# Patient Record
Sex: Female | Born: 1955 | Race: White | Hispanic: No | Marital: Single | Smoking: Never smoker
Health system: Southern US, Community
[De-identification: ages and names within clinical notes are randomized; demographics above are authoritative.]

## PROBLEM LIST (undated history)

## (undated) DIAGNOSIS — K529 Noninfective gastroenteritis and colitis, unspecified: Secondary | ICD-10-CM

## (undated) DIAGNOSIS — H409 Unspecified glaucoma: Secondary | ICD-10-CM

## (undated) DIAGNOSIS — I890 Lymphedema, not elsewhere classified: Secondary | ICD-10-CM

## (undated) HISTORY — PX: ANKLE SURGERY: SHX546

## (undated) HISTORY — PX: OTHER SURGICAL HISTORY: SHX169

## (undated) HISTORY — DX: Lymphedema, not elsewhere classified: I89.0

## (undated) HISTORY — PX: CATARACT EXTRACTION, BILATERAL: SHX1313

---

## 2015-03-17 ENCOUNTER — Emergency Department (HOSPITAL_COMMUNITY)
Admission: EM | Admit: 2015-03-17 | Discharge: 2015-03-17 | Disposition: A | Discharge status: Home / Self Care | Payer: BLUE CROSS/BLUE SHIELD | Attending: Emergency Medicine | Admitting: Emergency Medicine

## 2015-03-17 ENCOUNTER — Emergency Department (HOSPITAL_COMMUNITY): Payer: BLUE CROSS/BLUE SHIELD

## 2015-03-17 ENCOUNTER — Encounter (HOSPITAL_COMMUNITY): Payer: Self-pay | Admitting: Emergency Medicine

## 2015-03-17 DIAGNOSIS — Z79899 Other long term (current) drug therapy: Secondary | ICD-10-CM | POA: Insufficient documentation

## 2015-03-17 DIAGNOSIS — H409 Unspecified glaucoma: Secondary | ICD-10-CM | POA: Diagnosis not present

## 2015-03-17 DIAGNOSIS — N200 Calculus of kidney: Secondary | ICD-10-CM | POA: Insufficient documentation

## 2015-03-17 DIAGNOSIS — Z8719 Personal history of other diseases of the digestive system: Secondary | ICD-10-CM | POA: Diagnosis not present

## 2015-03-17 DIAGNOSIS — R109 Unspecified abdominal pain: Secondary | ICD-10-CM | POA: Diagnosis present

## 2015-03-17 HISTORY — DX: Unspecified glaucoma: H40.9

## 2015-03-17 HISTORY — DX: Noninfective gastroenteritis and colitis, unspecified: K52.9

## 2015-03-17 LAB — HEPATIC FUNCTION PANEL
ALT: 19 U/L (ref 14–54)
AST: 29 U/L (ref 15–41)
Albumin: 4 g/dL (ref 3.5–5.0)
Alkaline Phosphatase: 94 U/L (ref 38–126)
BILIRUBIN DIRECT: 0.2 mg/dL (ref 0.1–0.5)
BILIRUBIN INDIRECT: 0.7 mg/dL (ref 0.3–0.9)
Total Bilirubin: 0.9 mg/dL (ref 0.3–1.2)
Total Protein: 8.1 g/dL (ref 6.5–8.1)

## 2015-03-17 LAB — URINE MICROSCOPIC-ADD ON

## 2015-03-17 LAB — CBC WITH DIFFERENTIAL/PLATELET
Basophils Absolute: 0 10*3/uL (ref 0.0–0.1)
Basophils Relative: 0 %
EOS ABS: 0 10*3/uL (ref 0.0–0.7)
EOS PCT: 0 %
HCT: 45.3 % (ref 36.0–46.0)
Hemoglobin: 14.8 g/dL (ref 12.0–15.0)
LYMPHS ABS: 0.9 10*3/uL (ref 0.7–4.0)
LYMPHS PCT: 9 %
MCH: 31.2 pg (ref 26.0–34.0)
MCHC: 32.7 g/dL (ref 30.0–36.0)
MCV: 95.4 fL (ref 78.0–100.0)
MONO ABS: 0.4 10*3/uL (ref 0.1–1.0)
Monocytes Relative: 4 %
Neutro Abs: 8.8 10*3/uL — ABNORMAL HIGH (ref 1.7–7.7)
Neutrophils Relative %: 87 %
PLATELETS: 247 10*3/uL (ref 150–400)
RBC: 4.75 MIL/uL (ref 3.87–5.11)
RDW: 13.5 % (ref 11.5–15.5)
WBC: 10.1 10*3/uL (ref 4.0–10.5)

## 2015-03-17 LAB — BASIC METABOLIC PANEL
Anion gap: 11 (ref 5–15)
BUN: 20 mg/dL (ref 6–20)
CALCIUM: 9 mg/dL (ref 8.9–10.3)
CO2: 23 mmol/L (ref 22–32)
CREATININE: 0.85 mg/dL (ref 0.44–1.00)
Chloride: 103 mmol/L (ref 101–111)
GFR calc Af Amer: >60 mL/min (ref >60)
GLUCOSE: 141 mg/dL — AB (ref 65–99)
Potassium: 4.8 mmol/L (ref 3.5–5.1)
Sodium: 137 mmol/L (ref 135–145)

## 2015-03-17 LAB — URINALYSIS, ROUTINE W REFLEX MICROSCOPIC
BILIRUBIN URINE: NEGATIVE
GLUCOSE, UA: 100 mg/dL — AB
HGB URINE DIPSTICK: NEGATIVE
Ketones, ur: NEGATIVE mg/dL
Nitrite: NEGATIVE
PROTEIN: NEGATIVE mg/dL
Specific Gravity, Urine: 1.014 (ref 1.005–1.030)
pH: 8 (ref 5.0–8.0)

## 2015-03-17 LAB — LIPASE, BLOOD: LIPASE: 29 U/L (ref 11–51)

## 2015-03-17 MED ORDER — SODIUM CHLORIDE 0.9 % IV BOLUS (SEPSIS)
1000.0000 mL | Freq: Once | INTRAVENOUS | Status: AC
  Administered 2015-03-17: 1000 mL via INTRAVENOUS

## 2015-03-17 MED ORDER — HYDROCODONE-ACETAMINOPHEN 5-325 MG PO TABS: 1.0000 | ORAL_TABLET | Freq: Four times a day (QID) | ORAL | Status: DC | PRN

## 2015-03-17 MED ORDER — CEPHALEXIN 500 MG PO CAPS: 500.0000 mg | ORAL_CAPSULE | Freq: Four times a day (QID) | ORAL | Status: DC

## 2015-03-17 MED ORDER — KETOROLAC TROMETHAMINE 30 MG/ML IJ SOLN
30.0000 mg | Freq: Once | INTRAMUSCULAR | Status: AC
  Administered 2015-03-17: 30 mg via INTRAVENOUS
  Filled 2015-03-17: qty 1

## 2015-03-17 MED ORDER — ONDANSETRON HCL 4 MG/2ML IJ SOLN
4.0000 mg | Freq: Once | INTRAMUSCULAR | Status: AC
  Administered 2015-03-17: 4 mg via INTRAVENOUS
  Filled 2015-03-17: qty 2

## 2015-03-17 MED ORDER — IBUPROFEN 400 MG PO TABS: 400.0000 mg | ORAL_TABLET | Freq: Three times a day (TID) | ORAL | Status: AC

## 2015-03-17 MED ORDER — DEXTROSE 5 % IV SOLN
1.0000 g | Freq: Once | INTRAVENOUS | Status: AC
  Administered 2015-03-17: 1 g via INTRAVENOUS
  Filled 2015-03-17: qty 10

## 2015-03-17 MED ORDER — TAMSULOSIN HCL 0.4 MG PO CAPS
0.4000 mg | ORAL_CAPSULE | Freq: Once | ORAL | Status: AC
  Administered 2015-03-17: 0.4 mg via ORAL
  Filled 2015-03-17: qty 1

## 2015-03-17 MED ORDER — TAMSULOSIN HCL 0.4 MG PO CAPS: 0.4000 mg | ORAL_CAPSULE | Freq: Every day | ORAL | Status: DC

## 2015-03-17 NOTE — ED Notes (Signed)
Patient transported to CT 

## 2015-03-17 NOTE — ED Provider Notes (Signed)
CSN: [id number]     Arrival date & time 03/17/15  0818 History   First MD Initiated Contact with Patient 03/17/15 0831     Chief Complaint  Patient presents with  . Flank Pain    left     (Consider location/radiation/quality/duration/timing/severity/associated sxs/prior Treatment) Patient is a 60 y.o. female presenting with flank pain.  Flank Pain This is a new problem. The current episode started 3 to 5 hours ago. The problem has not changed since onset.Pertinent negatives include no chest pain and no shortness of breath. Nothing aggravates the symptoms. Nothing relieves the symptoms. She has tried nothing for the symptoms. The treatment provided no relief.    Past Medical History  Diagnosis Date  . Glaucoma   . Colitis    History reviewed. No pertinent past surgical history. No family history on file. Social History  Substance Use Topics  . Smoking status: Never Smoker   . Smokeless tobacco: None  . Alcohol Use: No   OB History    No data available     Review of Systems  Constitutional: Negative for fever and fatigue.  Eyes: Negative for pain.  Respiratory: Negative for cough and shortness of breath.   Cardiovascular: Negative for chest pain and palpitations.  Gastrointestinal: Negative for nausea and vomiting.  Endocrine: Negative for polydipsia and polyuria.  Genitourinary: Positive for flank pain. Negative for hematuria and menstrual problem.  Musculoskeletal: Positive for back pain. Negative for gait problem and neck pain.  Skin: Negative for pallor and wound.  All other systems reviewed and are negative.     Allergies  Review of patient's allergies indicates no known allergies.  Home Medications   Prior to Admission medications   Medication Sig Start Date End Date Taking? Authorizing Provider  carboxymethylcellulose (REFRESH TEARS) 0.5 % SOLN Place 1 drop into both eyes 2 (two) times daily.   Yes Historical Provider, MD  latanoprost (XALATAN) 0.005 %  ophthalmic solution Place 1 drop into both eyes at bedtime. 01/10/15  Yes Historical Provider, MD  loratadine (CLARITIN) 10 MG tablet Take 10 mg by mouth at bedtime.   Yes Historical Provider, MD  timolol (TIMOPTIC) 0.5 % ophthalmic solution Place 1 drop into both eyes 2 (two) times daily. 02/26/15  Yes Historical Provider, MD  cephALEXin (KEFLEX) 500 MG capsule Take 1 capsule (500 mg total) by mouth 4 (four) times daily. 03/17/15   Marily MemosJason Yuritza Paulhus, MD  HYDROcodone-acetaminophen (NORCO) 5-325 MG tablet Take 1-2 tablets by mouth every 6 (six) hours as needed. 03/17/15   Marily MemosJason Jerricka Carvey, MD  ibuprofen (ADVIL,MOTRIN) 400 MG tablet Take 1 tablet (400 mg total) by mouth 3 (three) times daily. 03/17/15   Marily MemosJason Edlin Ford, MD  tamsulosin (FLOMAX) 0.4 MG CAPS capsule Take 1 capsule (0.4 mg total) by mouth daily. 03/17/15   Barbara CowerJason Jasreet Dickie, MD   BP 152/80 mmHg  Pulse 70  Temp(Src) 98.8 F (37.1 C) (Oral)  Resp 20  SpO2 99% Physical Exam  ED Course  Procedures (including critical care time) Labs Review Labs Reviewed  URINALYSIS, ROUTINE W REFLEX MICROSCOPIC (NOT AT [facility]) - Abnormal; Notable for the following:    APPearance CLOUDY (*)    Glucose, UA 100 (*)    Leukocytes, UA TRACE (*)    All other components within normal limits  CBC WITH DIFFERENTIAL/PLATELET - Abnormal; Notable for the following:    Neutro Abs 8.8 (*)    All other components within normal limits  BASIC METABOLIC PANEL - Abnormal; Notable for the following:  Glucose, Bld 141 (*)    All other components within normal limits  URINE MICROSCOPIC-ADD ON - Abnormal; Notable for the following:    Squamous Epithelial / LPF 0-5 (*)    Bacteria, UA FEW (*)    All other components within normal limits  HEPATIC FUNCTION PANEL  LIPASE, BLOOD    Imaging Review Ct Renal Stone Study  03/17/2015  CLINICAL DATA:  Acute onset of left flank and low back pain since 5 o'clock this morning. EXAM: CT ABDOMEN AND PELVIS WITHOUT CONTRAST TECHNIQUE: Multidetector  CT imaging of the abdomen and pelvis was performed following the standard protocol without IV contrast. COMPARISON:  None. FINDINGS: Lower chest: The lung bases are clear of acute process. No pleural effusion or pulmonary lesions. The heart is normal in size. No pericardial effusion. The distal esophagus and aorta are unremarkable. Hepatobiliary: No focal hepatic lesions or intrahepatic biliary dilatation. The gallbladder is normal. No common bile duct dilatation. Pancreas: No mass, inflammation or ductal dilatation. Spleen: Normal size.  No focal lesions. Adrenals/Urinary Tract: The adrenal glands are normal. The right kidney is normal. No renal calculi or hydronephrosis. The right ureter is normal. High-grade obstructive findings involving the left kidney with hydronephrosis and perinephric interstitial changes and fluid. A small lower pole left renal calculus is noted. There is moderate left hydroureter down to an obstructing 2 mm calculus near the left UPJ. No bladder calculi or bladder mass. Stomach/Bowel: The stomach, duodenum, small bowel and colon are grossly normal without oral contrast. The terminal ileum is normal. The appendix is normal. Vascular/Lymphatic: No mesenteric or retroperitoneal mass or adenopathy. Small scattered lymph nodes are noted. The aorta is normal in caliber. Minimal scattered atherosclerotic calcifications. Reproductive: The uterus and ovaries are unremarkable. Other: No pelvic mass or adenopathy. No free pelvic fluid collections. No inguinal mass or adenopathy. No abdominal wall hernia or subcutaneous lesions. Musculoskeletal: No significant bony findings. IMPRESSION: 1. 2 mm distal left ureteral calculus causing high-grade obstruction. 2. Small lower pole left renal calculus. 3. No other significant abdominal/pelvic findings. Electronically Signed   By: Rudie Meyer M.D.   On: 03/17/2015 10:25   I have personally reviewed and evaluated these images and lab results as part of my  medical decision-making.   EKG Interpretation None      MDM   Final diagnoses:  Kidney stone   Ureterolithiasis v diverticulitis v uti.   Workup consistent with nephrolithiasis. Symptoms improved with treatment, stable for dc home.   I have personally and contemperaneously reviewed labs and imaging and used in my decision making as above.   A medical screening exam was performed and I feel the patient has had an appropriate workup for their chief complaint at this time and likelihood of emergent condition existing is low. Their vital signs are stable. They have been counseled on decision, discharge, follow up and which symptoms necessitate immediate return to the emergency department.  They verbally stated understanding and agreement with plan and discharged in stable condition.      Marily Memos, MD 03/19/15 [phone]

## 2015-03-17 NOTE — ED Notes (Signed)
Bed: [id number] Expected date:  Expected time:  Means of arrival:  Comments: EMS- 59yo F, kidney stones?

## 2015-03-17 NOTE — ED Notes (Addendum)
Per [facility] pt from home , co left flank pain radiating to lower back. started 5 Am this Am, difficulty urinating, also nausea and emesis. Per pt she has Hx kidney stone in past and passed on her own. denies hematuria. Alert and oriented x 4. Pt received 100 mcg Fentanyl IV at 8 Am by EMS.

## 2016-03-17 IMAGING — CT CT RENAL STONE PROTOCOL
2 of 3 series · 17 of 36 positions shown, 19 images · non-contrast
Comparison: None.

CLINICAL DATA: Acute onset of left flank and low back pain since 5
o'clock this morning.

EXAM:
CT ABDOMEN AND PELVIS WITHOUT CONTRAST
TECHNIQUE: Multidetector CT imaging of the abdomen and pelvis was performed
following the standard protocol without IV contrast.

[Series 4: coronal · coronal · 0.87mm/px · 3 of 96 slices shown]
[im 32/96  soft-tissue]
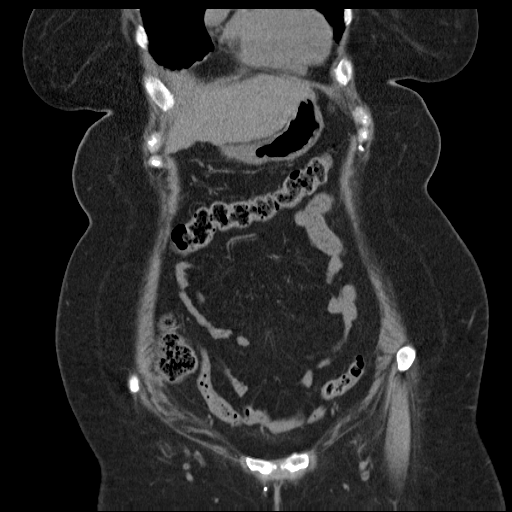
[im 43/96  soft-tissue]
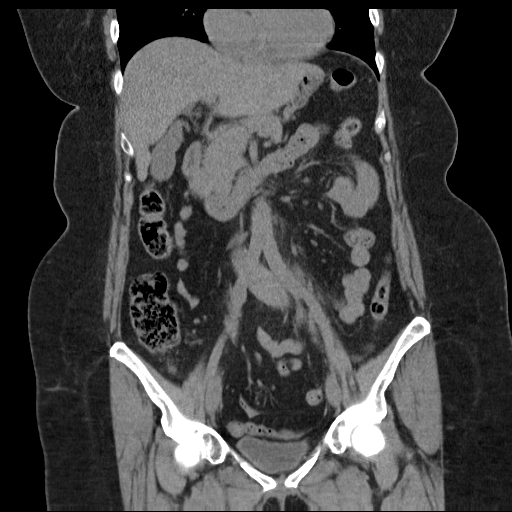
[im 53/96  soft-tissue]
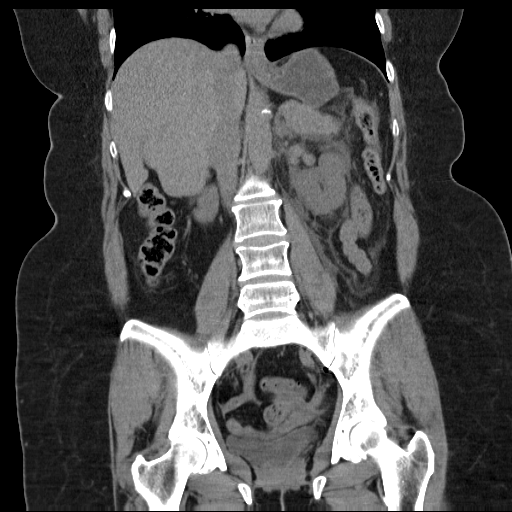

[Series 7: lung · axial · 0.73mm/px · z∈[+869,+964]mm · 14 of 22 slices shown, 16 images]
[im 2/22  soft-tissue]
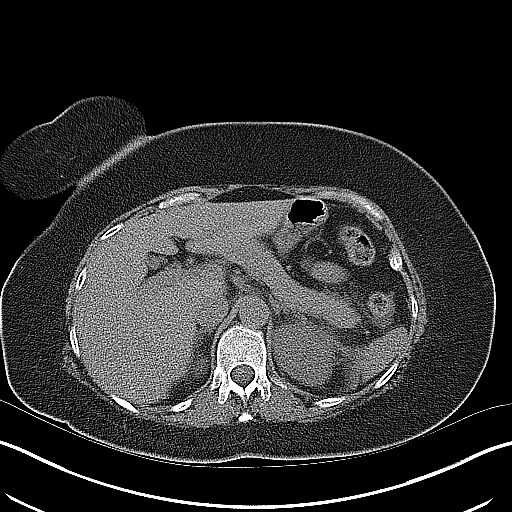
[im 2/22  bone]
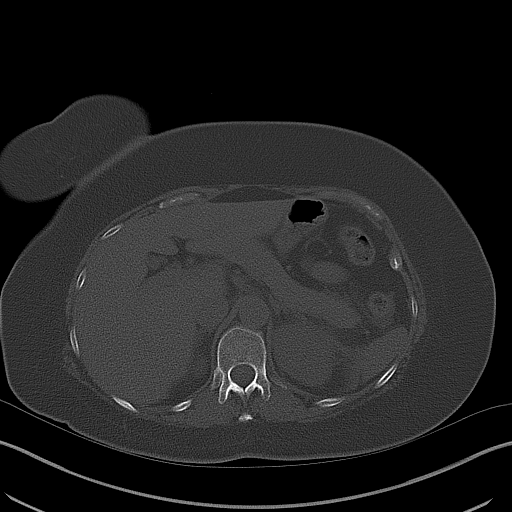
[im 4/22  soft-tissue]
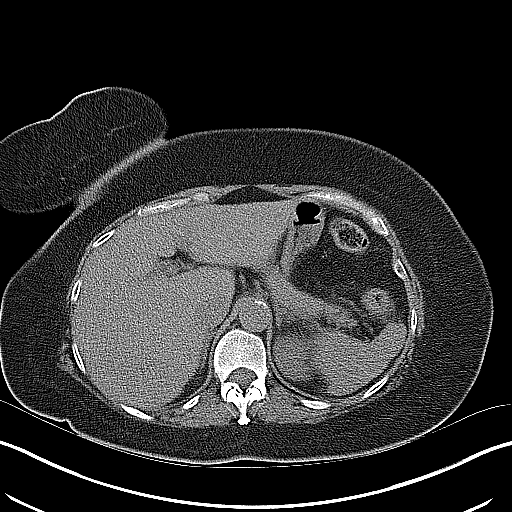
[im 5/22  soft-tissue]
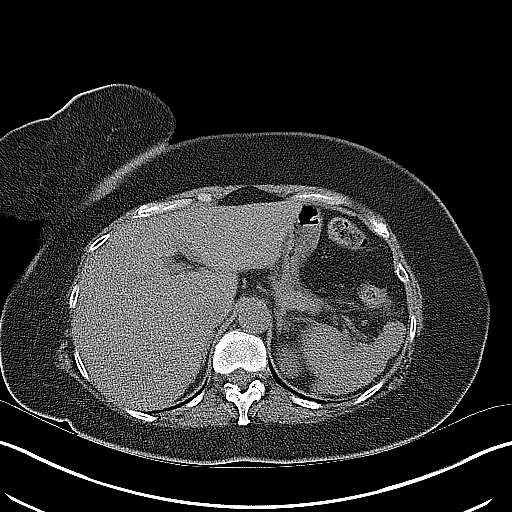
[im 7/22  soft-tissue]
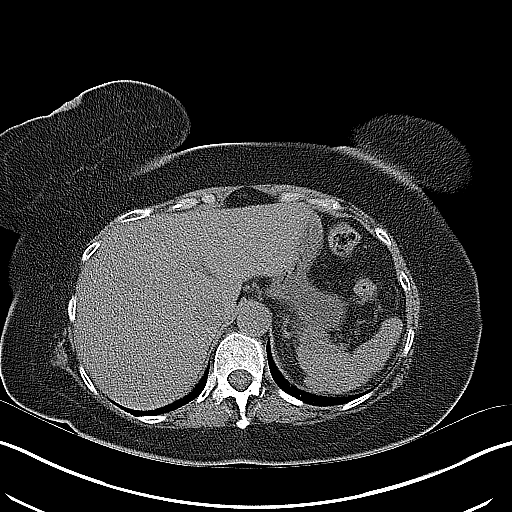
[im 8/22  soft-tissue]
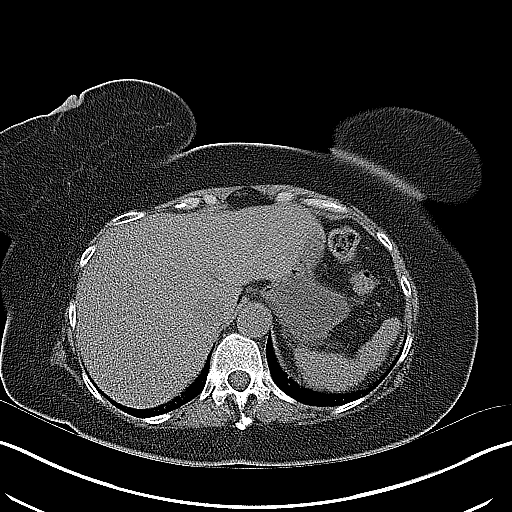
[im 9/22  soft-tissue]
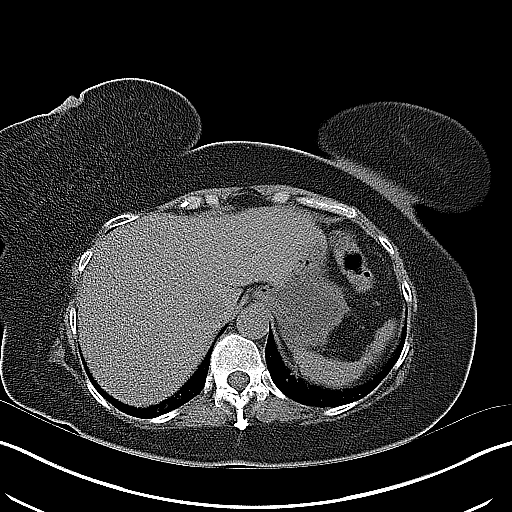
[im 11/22  soft-tissue]
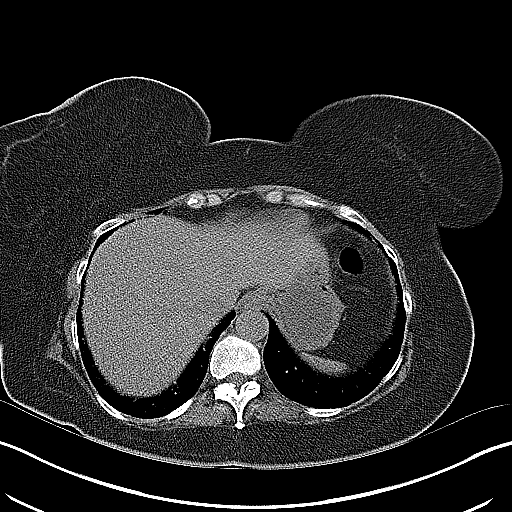
[im 12/22  soft-tissue]
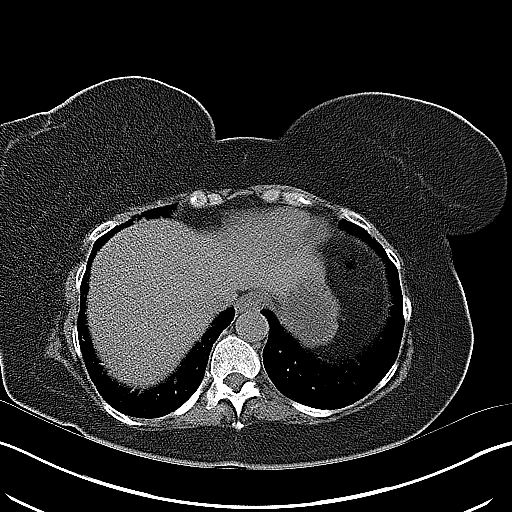
[im 14/22  soft-tissue]
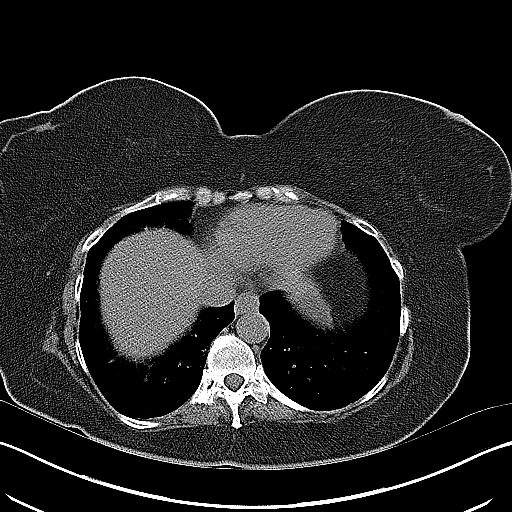
[im 14/22  bone]
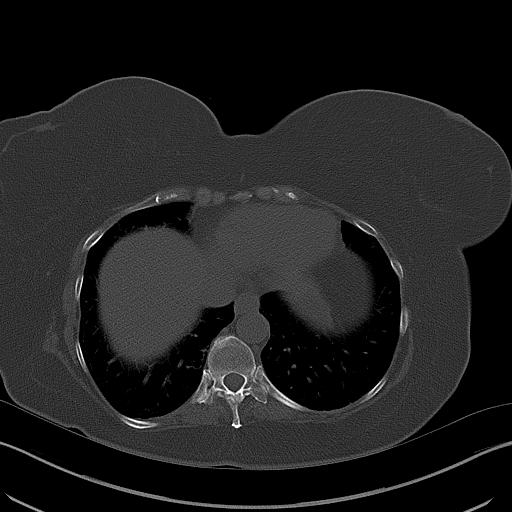
[im 15/22  soft-tissue]
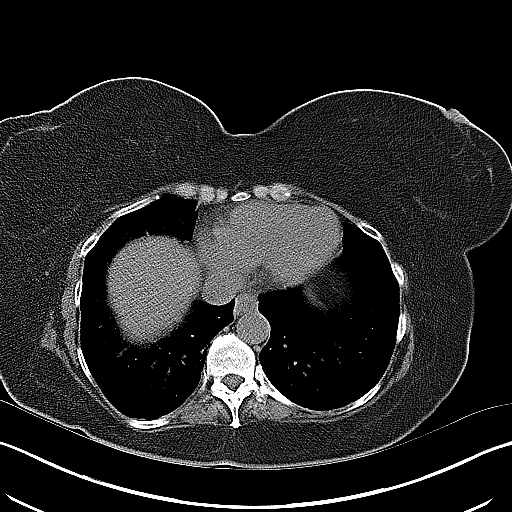
[im 17/22  soft-tissue]
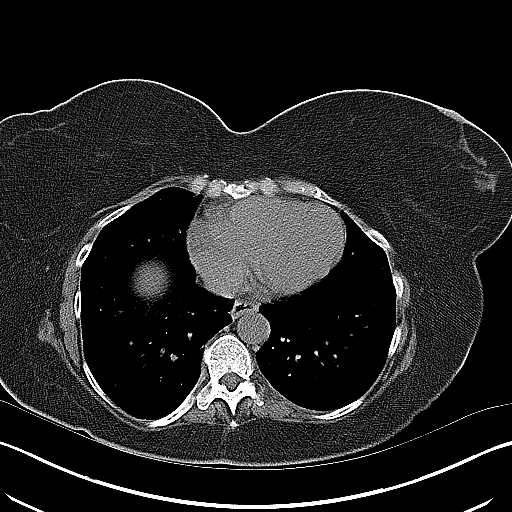
[im 18/22  soft-tissue]
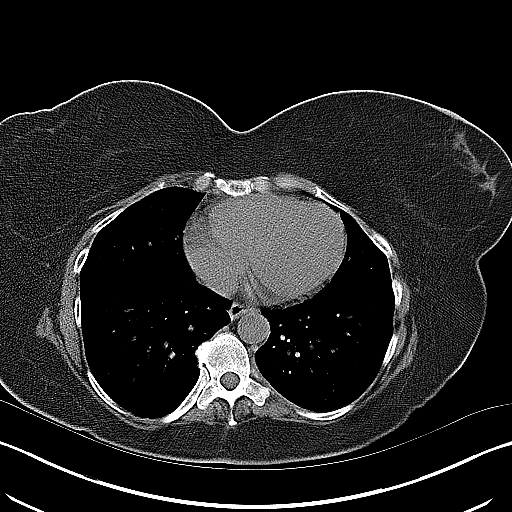
[im 19/22  soft-tissue]
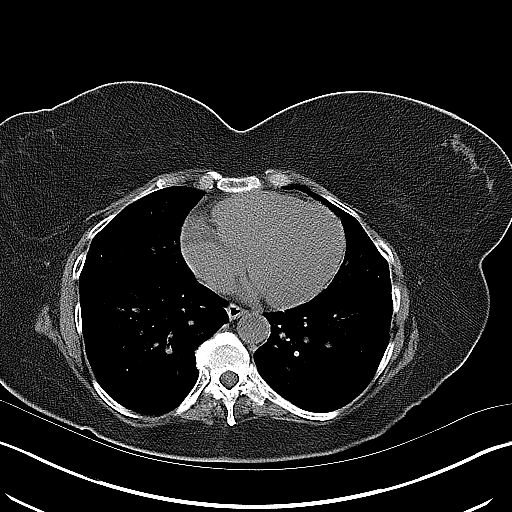
[im 21/22  soft-tissue]
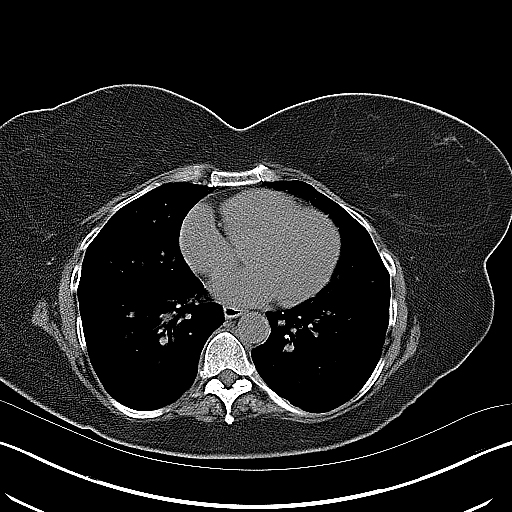

[17 of 36 positions shown; findings below may reference images not displayed]

FINDINGS: Lower chest: The lung bases are clear of acute process. No pleural
effusion or pulmonary lesions. The heart is normal in size. No
pericardial effusion. The distal esophagus and aorta are
unremarkable.

Hepatobiliary: No focal hepatic lesions or intrahepatic biliary
dilatation. The gallbladder is normal. No common bile duct
dilatation.

Pancreas: No mass, inflammation or ductal dilatation.

Spleen: Normal size.  No focal lesions.

Adrenals/Urinary Tract: The adrenal glands are normal.

The right kidney is normal. No renal calculi or hydronephrosis. The
right ureter is normal.

High-grade obstructive findings involving the left kidney with
hydronephrosis and perinephric interstitial changes and fluid. A
small lower pole left renal calculus is noted. There is moderate
left hydroureter down to an obstructing 2 mm calculus near the left
UPJ. No bladder calculi or bladder mass.

Stomach/Bowel: The stomach, duodenum, small bowel and colon are
grossly normal without oral contrast. The terminal ileum is normal.
The appendix is normal.

Vascular/Lymphatic: No mesenteric or retroperitoneal mass or
adenopathy. Small scattered lymph nodes are noted. The aorta is
normal in caliber. Minimal scattered atherosclerotic calcifications.

Reproductive: The uterus and ovaries are unremarkable.

Other: No pelvic mass or adenopathy. No free pelvic fluid
collections. No inguinal mass or adenopathy. No abdominal wall
hernia or subcutaneous lesions.

Musculoskeletal: No significant bony findings.
IMPRESSION: 1. 2 mm distal left ureteral calculus causing high-grade
obstruction.
2. Small lower pole left renal calculus.
3. No other significant abdominal/pelvic findings.

## 2016-04-27 ENCOUNTER — Ambulatory Visit: Payer: BLUE CROSS/BLUE SHIELD | Admitting: Occupational Therapy

## 2017-07-21 ENCOUNTER — Encounter: Payer: Self-pay | Admitting: Gynecology

## 2017-07-21 ENCOUNTER — Ambulatory Visit: Payer: BLUE CROSS/BLUE SHIELD | Admitting: Gynecology

## 2017-07-21 VITALS — BP 122/78 | Ht 62.0 in | Wt 174.0 lb

## 2017-07-21 DIAGNOSIS — R1032 Left lower quadrant pain: Secondary | ICD-10-CM

## 2017-07-21 DIAGNOSIS — N952 Postmenopausal atrophic vaginitis: Secondary | ICD-10-CM | POA: Diagnosis not present

## 2017-07-21 DIAGNOSIS — Z01419 Encounter for gynecological examination (general) (routine) without abnormal findings: Secondary | ICD-10-CM | POA: Diagnosis not present

## 2017-07-21 NOTE — Addendum Note (Signed)
Addended by: Dayna BarkerGARDNER, KIMBERLY K on: 07/21/2017 01:58 PM   Modules accepted: Orders
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
## 2017-07-21 NOTE — Patient Instructions (Signed)
Follow up for ultrasound as scheduled 

## 2017-07-24 LAB — PAP IG W/ RFLX HPV ASCU

## 2017-08-03 ENCOUNTER — Other Ambulatory Visit: Payer: Self-pay | Admitting: Gynecology

## 2017-08-03 ENCOUNTER — Encounter: Payer: Self-pay | Admitting: Gynecology

## 2017-08-03 ENCOUNTER — Ambulatory Visit: Payer: BLUE CROSS/BLUE SHIELD | Admitting: Gynecology

## 2017-08-03 ENCOUNTER — Ambulatory Visit (INDEPENDENT_AMBULATORY_CARE_PROVIDER_SITE_OTHER): Payer: BLUE CROSS/BLUE SHIELD

## 2017-08-03 VITALS — BP 126/80

## 2017-08-03 DIAGNOSIS — R1032 Left lower quadrant pain: Secondary | ICD-10-CM

## 2017-08-03 DIAGNOSIS — R102 Pelvic and perineal pain: Secondary | ICD-10-CM

## 2017-08-03 NOTE — Progress Notes (Signed)
    Tereasa Coopileen Sallie 12/02/1955 [id number]        62 y.o.  G0P0000 resents with a history of left inguinal type pain.  History outlined in her 07/21/2017 note.  Ultrasound was ordered to rule out ovarian pathology.  Past medical history,surgical history, problem list, medications, allergies, family history and social history were all reviewed and documented in the EPIC chart.  Directed ROS with pertinent positives and negatives documented in the history of present illness/assessment and plan.  Exam: Vitals:   08/03/17 1356  BP: 126/80   General appearance:  Normal  Ultrasound transabdominal shows uterus normal size and echotexture.  Endometrial echo 5.6 mm.  Left ovary normal and atrophic.  Right ovary not seen.  Right adnexa without pathology.  Cul-de-sac negative.  Assessment/Plan:  62 y.o. G0P0000 with left inguinal pain thought to be due to her history of lymphatic swelling.  Also being followed for lymphocytic colitis.  Having bloating that she actually just saw her gastroenterologist for and is going to start a new medication.  Ultrasound today was to rule out any possible ovarian contribution.  Her exam is normal with no evidence of GYN pathology.  Patient will continue to follow-up with her other physicians in reference to her lymphocytic colitis and lymphatic swelling.  She will follow-up here in 1 year for annual exam.   Dara Lordsimothy P Fontaine MD, 2:18 PM 08/03/2017

## 2017-08-03 NOTE — Patient Instructions (Signed)
Follow-up in 1 year for annual exam 

## 2018-07-26 ENCOUNTER — Ambulatory Visit: Payer: Medicare Other | Attending: Family Medicine

## 2018-07-26 ENCOUNTER — Other Ambulatory Visit: Payer: Self-pay

## 2018-07-26 DIAGNOSIS — R2689 Other abnormalities of gait and mobility: Secondary | ICD-10-CM | POA: Diagnosis present

## 2018-07-26 DIAGNOSIS — M25652 Stiffness of left hip, not elsewhere classified: Secondary | ICD-10-CM | POA: Insufficient documentation

## 2018-07-26 DIAGNOSIS — M79604 Pain in right leg: Secondary | ICD-10-CM | POA: Diagnosis present

## 2018-07-26 DIAGNOSIS — R293 Abnormal posture: Secondary | ICD-10-CM | POA: Insufficient documentation

## 2018-07-26 DIAGNOSIS — M6281 Muscle weakness (generalized): Secondary | ICD-10-CM

## 2018-07-26 NOTE — Patient Instructions (Signed)
Access Code: [email or web address]  URL: [email or web address]  Date: 07/26/2018  Prepared by: Sigurd Sos   Exercises  Seated Hamstring Stretch - 3 reps - 1 sets - 20 hold - 2x daily - 7x weekly  Seated Shoulder Horizontal Abduction with Resistance - 10 reps - 2 sets - 2x daily - 7x weekly  Seated Long Arc Quad - 10 reps - 2 sets - 3x daily - 7x weekly  Sit to Stand without Arm Support - 10 reps - 2 sets - 2x daily - 7x weekly

## 2018-07-26 NOTE — Therapy (Signed)
[facility] [address], [address] [place], [place], [address] Phone: [phone]   Fax:  [phone]  Physical Therapy Evaluation  Patient Details  Name: Penny Hayes MRN: [id number] Date of Birth: 03/14/1955 Referring Provider (PT): Tracey HarriesBouska, David, MD   Encounter Date: 07/26/2018  PT End of Session - 07/26/18 1519    Visit Number  1    Date for PT Re-Evaluation  09/20/18    Authorization Type  Medicare    PT Start Time  1430    PT Stop Time  1520    PT Time Calculation (min)  50 min    Activity Tolerance  Patient tolerated treatment well    Behavior During Therapy  [facility] for tasks assessed/performed       Past Medical History:  Diagnosis Date  . Colitis   . Glaucoma   . Lymphedema     Past Surgical History:  Procedure Laterality Date  . ANKLE SURGERY    . CATARACT EXTRACTION, BILATERAL    . eye injections      There were no vitals filed for this visit.   Subjective Assessment - 07/26/18 1435    Subjective  Pt presents to PT with complaints of Rt LE (quad and anterior leg below the knee) pain that began when she stepped into a hole 05/15/2018.  Pt has history of Lt hip pain and OA. Pt started taking Meloxicam ~10 days ago with significant improvement in symptoms.   Pt now reports Lt hip pain, Rt knee and LE pain and balance/endurance deficits.  Pt expresses an interest in becoming more active, healthy and improve overall mobility.    Pertinent History  Lt LE lympadema, glaucoma with peripheral vision loss    Limitations  Standing;Walking    How long can you stand comfortably?  15-20 minutes    How long can you walk comfortably?  unsure because of heat    Diagnostic tests  x-ray: ordered for Lt hip    Patient Stated Goals  improve mobility, get back to regular exercise, improve safety    Currently in Pain?  No/denies   Not since starting to take Meloxicam        [facility] - 07/26/18 0001      Assessment   Medical Diagnosis  Rt leg muscle strain    Referring Provider (PT)  Tracey HarriesBouska, David, MD    Onset Date/Surgical Date  05/15/18    Next MD Visit  none    Prior Therapy  none      Precautions   Precautions  Other (comment)    Precaution Comments  glaucoma with limited peripheral vision      Restrictions   Weight Bearing Restrictions  No      Balance Screen   Has the patient fallen in the past 6 months  Yes    How many times?  1   fall on 05/15/18 in a hole in yard   Has the patient had a decrease in activity level because of a fear of falling?   No    Is the patient reluctant to leave their home because of a fear of falling?   No      Home Environment   Living Environment  Private residence    Living Arrangements  Alone    Type of Home  Apartment    Home Access  Stairs to enter    Home Layout  One level      Prior Function  Level of Independence  Independent    Vocation  On disability   due to glaucoma   Leisure  reading, writing      Cognition   Overall Cognitive Status  Within Functional Limits for tasks assessed      Observation/Other Assessments   Focus on Therapeutic Outcomes (FOTO)   48% limitation      Posture/Postural Control   Posture/Postural Control  Postural limitations    Postural Limitations  Forward head;Rounded Shoulders;Flexed trunk      ROM / Strength   AROM / PROM / Strength  AROM;PROM;Strength      AROM   Overall AROM   Deficits    Overall AROM Comments  Lt hip flexibility is limited by 50% vs the Rt.  IR most limited on the Rt. Bil knee A/ROM is full without pain.        PROM   Overall PROM   Deficits    Overall PROM Comments  Lt hip P/ROM limited by 50% on the Lt vs Rt      Strength   Overall Strength  Deficits    Overall Strength Comments  Bil UE strength 4 to 4+/5.      Strength Assessment Site  Knee;Hip    Right/Left Hip  Right;Left    Right Hip Flexion  4/5    Right Hip External Rotation   4/5    Right Hip Internal  Rotation  4/5    Left Hip Flexion  3+/5    Left Hip External Rotation  4-/5    Left Hip Internal Rotation  4-/5    Right/Left Knee  Right;Left    Right Knee Flexion  4+/5    Right Knee Extension  4+/5    Left Knee Flexion  4+/5    Left Knee Extension  4+/5      Palpation   Palpation comment  palpable tenderness over Rt medial quad and medial knee joint line.  Hard end feel with Lt hip IR      Transfers   Transfers  Sit to Stand;Stand to Sit    Sit to Stand  With upper extremity assist    Five time sit to stand comments   23.28 seconds without UE suppot    Stand to Sit  With upper extremity assist      Ambulation/Gait   Ambulation/Gait  Yes    Gait Pattern  Step-through pattern;Decreased dorsiflexion - right;Decreased dorsiflexion - left;Decreased stride length                Objective measurements completed on examination: See above findings.              PT Education - 07/26/18 1515    Education Details  Access Code: [email or web address]    Person(s) Educated  Patient    Methods  Explanation;Demonstration;Handout    Comprehension  Verbalized understanding;Returned demonstration       PT Short Term Goals - 07/26/18 1434      PT SHORT TERM GOAL #1   Title  be independent in initial HEP    Time  4    Period  Weeks    Status  New    Target Date  08/23/18      PT SHORT TERM GOAL #2   Title  demonstrate erect posture in sitting and standing and report corrections with activity    Time  4    Period  Weeks    Status  New    Target Date  08/23/18      PT SHORT TERM GOAL #3   Title  improve 5x sit to stand to < or = to 20 seconds to improve balance and safety    Time  8    Period  Weeks    Status  New    Target Date  09/20/18      PT SHORT TERM GOAL #4   Title  initiate walking program and verbalize understanding of how to safely progress    Time  8    Period  Weeks    Status  New    Target Date  09/20/18        PT Long Term Goals - 07/26/18 1434       PT LONG TERM GOAL #1   Title  be independent in advanced HEP    Time  8    Period  Weeks    Status  New    Target Date  09/20/18      PT LONG TERM GOAL #2   Title  reduce FOTO to < or = to 35% limitation    Time  8    Period  Weeks    Status  New    Target Date  09/20/18      PT LONG TERM GOAL #3   Title  perform 5x sit to stand in < or = to 14 seconds to improve safety and stability    Time  8    Period  Weeks    Status  New    Target Date  09/20/18      PT LONG TERM GOAL #4   Title  demonstrate 4/5 to 4+/5 bil hip strength to improve endurance for walking and community tasks    Time  8    Period  Weeks    Status  New    Target Date  09/20/18      PT LONG TERM GOAL #5   Title  demonstrate symmetry with ambulation with heel strike bilaterally    Time  8    Period  Weeks    Status  New    Target Date  09/20/18             Plan - 07/26/18 1541    Clinical Impression Statement  Pt presents to PT s/p a fall when stepping into a 7 inch hold in her yard on 05/15/18.  Pt has history of Lt hip OA and pain and had resultant Rt thigh and LE pain after this fall.  10 days ago, she started Meloxicam and her Rt LE pain diminished.  Pt with main concern of weakness and worsening balance and overall deconditioning.  Pt demonstrates poor posture with flexed trunk, forward head and rounded shoulder posture.  Pt with limited Lt hip A/ROM, reduced strength in the hips and gait abnormality. Pt performed 5x sit to stand in 23.28 seconds with uncontrolled descent indicating a moderate falls risk.  Pt will benefit from skilled PT for bil LE strength, postural and core strength and balance/gait training to improve safety, endurance and overall conditioning.    Personal Factors and Comorbidities  Comorbidity 1;Comorbidity 2    Comorbidities  glaucoma with limited peripheral vision, Rt ankle surgery, Lt hip OA    Examination-Activity Limitations  Locomotion Level;Stand     Examination-Participation Restrictions  Community Activity    Stability/Clinical Decision Making  Evolving/Moderate complexity    Clinical Decision Making  Moderate    Rehab Potential  Good  PT Frequency  2x / week    PT Duration  8 weeks    PT Treatment/Interventions  ADLs/Self Care Home Management;Gait training;Stair training;Functional mobility training;Therapeutic activities;Therapeutic exercise;Balance training;Neuromuscular re-education;Manual techniques;Patient/family education;Passive range of motion;Taping;Dry needling    PT Next Visit Plan  work on postural strength, LE strength, gait, balance, Lt hip ROM    PT Home Exercise Plan  Access Code: [email or web address]    Consulted and Agree with Plan of Care  Patient       Patient will benefit from skilled therapeutic intervention in order to improve the following deficits and impairments:  Abnormal gait, Decreased activity tolerance, Decreased balance, Decreased mobility, Decreased endurance, Decreased range of motion, Decreased strength, Hypomobility, Impaired flexibility, Postural dysfunction  Visit Diagnosis: 1. Other abnormalities of gait and mobility   2. Muscle weakness (generalized)   3. Abnormal posture   4. Stiffness of left hip, not elsewhere classified   5. Pain in right leg        Problem List There are no active problems to display for this patient.   Sigurd Sos, PT 07/26/18 3:46 PM  [facility] [address] [place], [place], [address] Phone: [phone]   Fax:  [phone]  Name: Khamya Topp MRN: [id number] Date of Birth: 02/15/1955

## 2018-08-07 ENCOUNTER — Other Ambulatory Visit: Payer: Self-pay

## 2018-08-07 ENCOUNTER — Ambulatory Visit: Payer: Medicare Other | Attending: Family Medicine

## 2018-08-07 DIAGNOSIS — R2689 Other abnormalities of gait and mobility: Secondary | ICD-10-CM

## 2018-08-07 DIAGNOSIS — M79604 Pain in right leg: Secondary | ICD-10-CM | POA: Diagnosis present

## 2018-08-07 DIAGNOSIS — M6281 Muscle weakness (generalized): Secondary | ICD-10-CM | POA: Diagnosis present

## 2018-08-07 DIAGNOSIS — R293 Abnormal posture: Secondary | ICD-10-CM | POA: Insufficient documentation

## 2018-08-07 DIAGNOSIS — M25652 Stiffness of left hip, not elsewhere classified: Secondary | ICD-10-CM

## 2018-08-07 NOTE — Therapy (Signed)
[facility] [address], [address] [place], [place], [address] Phone: [phone]   Fax:  [phone]  Physical Therapy Treatment  Patient Details  Name: Penny Hayes MRN: [id number] Date of Birth: 08/14/1955 Referring Provider (PT): Tracey HarriesBouska, David, MD   Encounter Date: 08/07/2018  PT End of Session - 08/07/18 1046    Visit Number  2    Date for PT Re-Evaluation  09/20/18    Authorization Type  Medicare    PT Start Time  1001    PT Stop Time  1043    PT Time Calculation (min)  42 min    Activity Tolerance  Patient tolerated treatment well    Behavior During Therapy  [facility] for tasks assessed/performed       Past Medical History:  Diagnosis Date  . Colitis   . Glaucoma   . Lymphedema     Past Surgical History:  Procedure Laterality Date  . ANKLE SURGERY    . CATARACT EXTRACTION, BILATERAL    . eye injections      There were no vitals filed for this visit.  Subjective Assessment - 08/07/18 1010    Subjective  I'm doing well with my exercises at home.  I'm stiff today.    Currently in Pain?  No/denies                       [facility]/Exercise - 08/07/18 0001      Exercises   Exercises  Knee/Hip;Ankle;Lumbar      Lumbar Exercises: Stretches   Figure 4 Stretch  2 reps;20 seconds;Seated;With overpressure      Lumbar Exercises: Standing   Row  Strengthening;Both;20 reps;Theraband    Theraband Level (Row)  Level 2 (Red)    Shoulder Extension  Strengthening;Both;20 reps;Theraband    Theraband Level (Shoulder Extension)  Level 2 (Red)      Lumbar Exercises: Seated   Other Seated Lumbar Exercises  horizontal abduction red band 2x10      Knee/Hip Exercises: Stretches   Active Hamstring Stretch  Both;3 reps;20 seconds      Knee/Hip Exercises: Aerobic   Nustep  Level 1 x 8 minutes    PT present to discuss progress     Knee/Hip Exercises: Standing   Hip Abduction  Stengthening;Both;2  sets;10 reps    Hip Extension  Stengthening;Both;2 sets;10 reps    Rocker Board  3 minutes      Knee/Hip Exercises: Seated   Long Arc Quad  Strengthening;Both;1 set;10 reps    Sit to [employer]Sand  20 reps;without UE support               PT Short Term Goals - 07/26/18 1434      PT SHORT TERM GOAL #1   Title  be independent in initial HEP    Time  4    Period  Weeks    Status  New    Target Date  08/23/18      PT SHORT TERM GOAL #2   Title  demonstrate erect posture in sitting and standing and report corrections with activity    Time  4    Period  Weeks    Status  New    Target Date  08/23/18      PT SHORT TERM GOAL #3   Title  improve 5x sit to stand to < or = to 20 seconds to improve balance and safety    Time  8  Period  Weeks    Status  New    Target Date  09/20/18      PT SHORT TERM GOAL #4   Title  initiate walking program and verbalize understanding of how to safely progress    Time  8    Period  Weeks    Status  New    Target Date  09/20/18        PT Long Term Goals - 07/26/18 1434      PT LONG TERM GOAL #1   Title  be independent in advanced HEP    Time  8    Period  Weeks    Status  New    Target Date  09/20/18      PT LONG TERM GOAL #2   Title  reduce FOTO to < or = to 35% limitation    Time  8    Period  Weeks    Status  New    Target Date  09/20/18      PT LONG TERM GOAL #3   Title  perform 5x sit to stand in < or = to 14 seconds to improve safety and stability    Time  8    Period  Weeks    Status  New    Target Date  09/20/18      PT LONG TERM GOAL #4   Title  demonstrate 4/5 to 4+/5 bil hip strength to improve endurance for walking and community tasks    Time  8    Period  Weeks    Status  New    Target Date  09/20/18      PT LONG TERM GOAL #5   Title  demonstrate symmetry with ambulation with heel strike bilaterally    Time  8    Period  Weeks    Status  New    Target Date  09/20/18            Plan - 08/07/18  1031    Clinical Impression Statement  Pt with first time follow-up after evaluation.  Pt is independent and compliant with HEP and requires minor verbal cues for full knee extension with hamstring stretch.  Pt with improved technique with sit to stand today with controlled consent.  Pt requires frequent verbal cues for posture and scapular depression with theraband exercises.  Pt will continue to benefit from skilled PT for LE strength, flexibility and postural alignment.    PT Frequency  2x / week    PT Duration  8 weeks    PT Treatment/Interventions  ADLs/Self Care Home Management;Gait training;Stair training;Functional mobility training;Therapeutic activities;Therapeutic exercise;Balance training;Neuromuscular re-education;Manual techniques;Patient/family education;Passive range of motion;Taping;Dry needling    PT Next Visit Plan  work on postural strength, LE strength, gait, balance, Lt hip ROM.  Add to HEP: scapular theraband (rows/extension), balance exercises    PT Home Exercise Plan  Access Code: [email or web address]    Consulted and Agree with Plan of Care  Patient       Patient will benefit from skilled therapeutic intervention in order to improve the following deficits and impairments:  Abnormal gait, Decreased activity tolerance, Decreased balance, Decreased mobility, Decreased endurance, Decreased range of motion, Decreased strength, Hypomobility, Impaired flexibility, Postural dysfunction  Visit Diagnosis: 1. Muscle weakness (generalized)   2. Other abnormalities of gait and mobility   3. Abnormal posture   4. Stiffness of left hip, not elsewhere classified   5. Pain in right leg  Problem List There are no active problems to display for this patient.    Sigurd Sos, PT 08/07/18 10:49 AM  [facility] [address] [place], [place], [address] Phone: [phone]   Fax:  [phone]  Name: Penny Hayes MRN:  [id number] Date of Birth: 1955-09-01

## 2018-08-10 ENCOUNTER — Ambulatory Visit: Payer: Medicare Other | Admitting: Physical Therapy

## 2018-08-14 ENCOUNTER — Other Ambulatory Visit: Payer: Self-pay

## 2018-08-14 ENCOUNTER — Ambulatory Visit: Payer: Medicare Other

## 2018-08-14 DIAGNOSIS — M6281 Muscle weakness (generalized): Secondary | ICD-10-CM

## 2018-08-14 DIAGNOSIS — R2689 Other abnormalities of gait and mobility: Secondary | ICD-10-CM

## 2018-08-14 DIAGNOSIS — M79604 Pain in right leg: Secondary | ICD-10-CM

## 2018-08-14 DIAGNOSIS — R293 Abnormal posture: Secondary | ICD-10-CM

## 2018-08-14 DIAGNOSIS — M25652 Stiffness of left hip, not elsewhere classified: Secondary | ICD-10-CM

## 2018-08-14 NOTE — Patient Instructions (Signed)
Access Code: [email or web address]  URL: [email or web address]  Date: 08/14/2018  Prepared by: Sigurd Sos   Exercises Standing Hip Abduction - 10 reps - 2 sets - 2x daily - 7x weekly Standing Hip Extension - 10 reps - 2 sets - 2x daily - 7x weekly Standing Row with Anchored Resistance - 10 reps - 2 sets - 2x daily - 7x weekly Shoulder extension with resistance - Neutral - 10 reps - 2 sets - 7x weekly

## 2018-08-14 NOTE — Therapy (Signed)
[facility] [address], [address] [place], [place], [address] Phone: [phone]   Fax:  [phone]  Physical Therapy Treatment  Patient Details  Name: Penny Hayes MRN: [id number] Date of Birth: 03/09/1955 Referring Provider (PT): Tracey HarriesBouska, David, MD   Encounter Date: 08/14/2018  PT End of Session - 08/14/18 1045    Visit Number  3    Date for PT Re-Evaluation  09/20/18    Authorization Type  Medicare    PT Start Time  1001    PT Stop Time  1043    PT Time Calculation (min)  42 min    Activity Tolerance  Patient tolerated treatment well    Behavior During Therapy  [facility] for tasks assessed/performed       Past Medical History:  Diagnosis Date  . Colitis   . Glaucoma   . Lymphedema     Past Surgical History:  Procedure Laterality Date  . ANKLE SURGERY    . CATARACT EXTRACTION, BILATERAL    . eye injections      There were no vitals filed for this visit.  Subjective Assessment - 08/14/18 0959    Subjective  I am doing well.    Pertinent History  Lt LE lympadema, glaucoma with peripheral vision loss    Currently in Pain?  No/denies         [facility] Assessment - 08/14/18 0001      Transfers   Five time sit to stand comments   18.91 seconds without UE support                   [facility] Adult PT Treatment/Exercise - 08/14/18 0001      Lumbar Exercises: Stretches   Figure 4 Stretch  2 reps;20 seconds;Seated;With overpressure      Lumbar Exercises: Standing   Row  Strengthening;Both;20 reps;Theraband    Theraband Level (Row)  Level 2 (Red)    Shoulder Extension  Strengthening;Both;20 reps;Theraband    Theraband Level (Shoulder Extension)  Level 2 (Red)      Knee/Hip Exercises: Stretches   Active Hamstring Stretch  Both;3 reps;20 seconds      Knee/Hip Exercises: Aerobic   Nustep  Level 1 x 8 minutes    PT present to discuss progress     Knee/Hip Exercises: Standing   Hip Abduction   Stengthening;Both;2 sets;10 reps    Hip Extension  Stengthening;Both;2 sets;10 reps    Rocker Board  3 minutes    Other Standing Knee Exercises  standing on black balance pad: weight shifting 3 ways x 1.5 minutes each without UE suppport.        Knee/Hip Exercises: Seated   Sit to Sand  20 reps;without UE support             PT Education - 08/14/18 1024    Education Details  Access Code: [email or web address]    Person(s) Educated  Patient    Methods  Explanation;Demonstration;Handout    Comprehension  Verbalized understanding;Returned demonstration       PT Short Term Goals - 08/14/18 1003      PT SHORT TERM GOAL #1   Title  be independent in initial HEP    Status  Achieved      PT SHORT TERM GOAL #4   Title  initiate walking program and verbalize understanding of how to safely progress    Baseline  too hot    Time  8    Period  Weeks  Status  On-going        PT Long Term Goals - 07/26/18 1434      PT LONG TERM GOAL #1   Title  be independent in advanced HEP    Time  8    Period  Weeks    Status  New    Target Date  09/20/18      PT LONG TERM GOAL #2   Title  reduce FOTO to < or = to 35% limitation    Time  8    Period  Weeks    Status  New    Target Date  09/20/18      PT LONG TERM GOAL #3   Title  perform 5x sit to stand in < or = to 14 seconds to improve safety and stability    Time  8    Period  Weeks    Status  New    Target Date  09/20/18      PT LONG TERM GOAL #4   Title  demonstrate 4/5 to 4+/5 bil hip strength to improve endurance for walking and community tasks    Time  8    Period  Weeks    Status  New    Target Date  09/20/18      PT LONG TERM GOAL #5   Title  demonstrate symmetry with ambulation with heel strike bilaterally    Time  8    Period  Weeks    Status  New    Target Date  09/20/18            Plan - 08/14/18 1014    Clinical Impression Statement  Pt is making steady progress with strength, endurance and balance.  Pt with  improved time with 5x sit to stand to 18.91 indicating a lower falls risk although falls risk is still high.  Pt demonstrates improved control with stand to sit transition.  Pt reports using less UE support with negotiating steps.  PT added standing strength exercises to address functional balance.  Pt required minor demo and tactile cues for safety and technique throughout session.  Pt will continue to benefit from skilled PT for balance, strength and endurance activities.    PT Frequency  2x / week    PT Duration  8 weeks    PT Treatment/Interventions  ADLs/Self Care Home Management;Gait training;Stair training;Functional mobility training;Therapeutic activities;Therapeutic exercise;Balance training;Neuromuscular re-education;Manual techniques;Patient/family education;Passive range of motion;Taping;Dry needling    PT Next Visit Plan  work on postural strength, LE strength, gait, balance, Lt hip ROM. Review new HEP.    PT Home Exercise Plan  Access Code: [email or web address]    Consulted and Agree with Plan of Care  Patient       Patient will benefit from skilled therapeutic intervention in order to improve the following deficits and impairments:  Abnormal gait, Decreased activity tolerance, Decreased balance, Decreased mobility, Decreased endurance, Decreased range of motion, Decreased strength, Hypomobility, Impaired flexibility, Postural dysfunction  Visit Diagnosis: 1. Other abnormalities of gait and mobility   2. Muscle weakness (generalized)   3. Abnormal posture   4. Stiffness of left hip, not elsewhere classified   5. Pain in right leg        Problem List There are no active problems to display for this patient.   Sigurd Sos, PT 08/14/18 10:49 AM  [facility] [address], [place], [place], [address] Phone: [phone]   Fax:  [phone]  Name: Penny Hayes MRN: [id number] Date of Birth: 03/06/1955

## 2018-08-17 ENCOUNTER — Ambulatory Visit: Payer: Medicare Other | Admitting: Physical Therapy

## 2018-08-17 ENCOUNTER — Encounter: Payer: Self-pay | Admitting: Physical Therapy

## 2018-08-17 ENCOUNTER — Other Ambulatory Visit: Payer: Self-pay

## 2018-08-17 DIAGNOSIS — M25652 Stiffness of left hip, not elsewhere classified: Secondary | ICD-10-CM

## 2018-08-17 DIAGNOSIS — R293 Abnormal posture: Secondary | ICD-10-CM

## 2018-08-17 DIAGNOSIS — M6281 Muscle weakness (generalized): Secondary | ICD-10-CM | POA: Diagnosis not present

## 2018-08-17 DIAGNOSIS — R2689 Other abnormalities of gait and mobility: Secondary | ICD-10-CM

## 2018-08-17 DIAGNOSIS — M79604 Pain in right leg: Secondary | ICD-10-CM

## 2018-08-17 NOTE — Therapy (Signed)
[facility] [address], [place], [place], [address] Phone: [phone]   Fax:  [phone]  Physical Therapy Treatment  Patient Details  Name: Penny Hayes MRN: [id number] Date of Birth: [birth year] Referring Provider (PT): Bernerd Limbo, MD   Encounter Date: 08/17/2018  PT End of Session - 08/17/18 1013    Visit Number  4    Date for PT Re-Evaluation  09/20/18    Authorization Type  Medicare    PT Start Time  1013    PT Stop Time  1052    PT Time Calculation (min)  39 min    Activity Tolerance  Patient tolerated treatment well    Behavior During Therapy  [facility] for tasks assessed/performed       Past Medical History:  Diagnosis Date  . Colitis   . Glaucoma   . Lymphedema     Past Surgical History:  Procedure Laterality Date  . ANKLE SURGERY    . CATARACT EXTRACTION, BILATERAL    . eye injections      There were no vitals filed for this visit.  Subjective Assessment - 08/17/18 1016    Subjective  I put my socks on this AM so much easier than I have been able to in the past. My hip is sore today,    Pertinent History  Lt LE lympadema, glaucoma with peripheral vision loss    Currently in Pain?  Yes    Pain Score  4     Pain Location  Hip    Pain Orientation  Left    Pain Descriptors / Indicators  Sore    Aggravating Factors   Certian motions    Pain Relieving Factors  meds, my stretches    Multiple Pain Sites  No                       [facility] Adult PT Treatment/Exercise - 08/17/18 0001      Self-Care   Self-Care  Lifting    Lifting  Golfers lift to unweight the LTLE. PT A demo, pt demo correctly      Lumbar Exercises: Stretches   Other Lumbar Stretch Exercise  Door way pec stretch 2x 20 sec       Lumbar Exercises: Aerobic   UBE (Upper Arm Bike)  At end of session reverse L1 x 4 min      Lumbar Exercises: Standing   Row  Strengthening;Both;Theraband   2x15, VC for posture/core    Theraband Level (Row)  Level 2 (Red)    Shoulder Extension  Strengthening;Both;Theraband   2x15   Theraband Level (Shoulder Extension)  Level 2 (Red)      Knee/Hip Exercises: Stretches   Active Hamstring Stretch  Both;1 rep;30 seconds      Knee/Hip Exercises: Aerobic   Nustep  Level 1 x 8 minutes    PTA present to discuss progress     Knee/Hip Exercises: Seated   Sit to Sand  --   20x holding 5# wt from mat              PT Short Term Goals - 08/14/18 1003      PT SHORT TERM GOAL #1   Title  be independent in initial HEP    Status  Achieved      PT SHORT TERM GOAL #4   Title  initiate walking program and verbalize understanding of how to safely progress    Baseline  too  hot    Time  8    Period  Weeks    Status  On-going        PT Long Term Goals - 07/26/18 1434      PT LONG TERM GOAL #1   Title  be independent in advanced HEP    Time  8    Period  Weeks    Status  New    Target Date  09/20/18      PT LONG TERM GOAL #2   Title  reduce FOTO to < or = to 35% limitation    Time  8    Period  Weeks    Status  New    Target Date  09/20/18      PT LONG TERM GOAL #3   Title  perform 5x sit to stand in < or = to 14 seconds to improve safety and stability    Time  8    Period  Weeks    Status  New    Target Date  09/20/18      PT LONG TERM GOAL #4   Title  demonstrate 4/5 to 4+/5 bil hip strength to improve endurance for walking and community tasks    Time  8    Period  Weeks    Status  New    Target Date  09/20/18      PT LONG TERM GOAL #5   Title  demonstrate symmetry with ambulation with heel strike bilaterally    Time  8    Period  Weeks    Status  New    Target Date  09/20/18            Plan - 08/17/18 1015    Clinical Impression Statement  Pt requiring mainly postural cues for her standing exercises. Shr requested assistance in ways to pick light items off the floor that did not hurt her hip. Concentrated a lot on improving her  postural strngth and endurance today and not so much on her LE stregnth. Pt felt "tired" at end of session, but a good tired.    Personal Factors and Comorbidities  Comorbidity 1;Comorbidity 2    Comorbidities  glaucoma with limited peripheral vision, Rt ankle surgery, Lt hip OA    Examination-Activity Limitations  Locomotion Level;Stand    Examination-Participation Restrictions  Community Activity    Stability/Clinical Decision Making  Evolving/Moderate complexity    Rehab Potential  Good    PT Frequency  2x / week    PT Duration  8 weeks    PT Treatment/Interventions  ADLs/Self Care Home Management;Gait training;Stair training;Functional mobility training;Therapeutic activities;Therapeutic exercise;Balance training;Neuromuscular re-education;Manual techniques;Patient/family education;Passive range of motion;Taping;Dry needling    PT Next Visit Plan  work on postural strength, LE strength, gait, balance, Lt hip ROM. Review new HEP.    PT Home Exercise Plan  Access Code: [email or web address]    Consulted and Agree with Plan of Care  Patient       Patient will benefit from skilled therapeutic intervention in order to improve the following deficits and impairments:  Abnormal gait, Decreased activity tolerance, Decreased balance, Decreased mobility, Decreased endurance, Decreased range of motion, Decreased strength, Hypomobility, Impaired flexibility, Postural dysfunction  Visit Diagnosis: 1. Other abnormalities of gait and mobility   2. Muscle weakness (generalized)   3. Abnormal posture   4. Stiffness of left hip, not elsewhere classified   5. Pain in right leg        Problem List There  are no active problems to display for this patient.   ,, PTA 08/17/2018, 10:49 AM  [facility] [address], [address] [place], [place], [address] Phone: [phone]   Fax:  [phone]  Name: Tereasa Coopileen Westergard MRN: [id number] Date of Birth:  08/27/1955

## 2018-08-20 ENCOUNTER — Ambulatory Visit: Payer: Medicare Other

## 2018-08-20 ENCOUNTER — Other Ambulatory Visit: Payer: Self-pay

## 2018-08-20 DIAGNOSIS — M25652 Stiffness of left hip, not elsewhere classified: Secondary | ICD-10-CM

## 2018-08-20 DIAGNOSIS — M6281 Muscle weakness (generalized): Secondary | ICD-10-CM

## 2018-08-20 DIAGNOSIS — R293 Abnormal posture: Secondary | ICD-10-CM

## 2018-08-20 DIAGNOSIS — R2689 Other abnormalities of gait and mobility: Secondary | ICD-10-CM

## 2018-08-20 DIAGNOSIS — M79604 Pain in right leg: Secondary | ICD-10-CM

## 2018-08-20 NOTE — Therapy (Signed)
[facility] [address], [address] [place], [place], [address] Phone: [phone]   Fax:  [phone]  Physical Therapy Treatment  Patient Details  Name: Tereasa Coopileen Bernstein MRN: [id number] Date of Birth: 11/16/1955 Referring Provider (PT): Tracey HarriesBouska, David, MD   Encounter Date: 08/20/2018  PT End of Session - 08/20/18 1050    Visit Number  5    Date for PT Re-Evaluation  09/20/18    Authorization Type  Medicare    PT Start Time  1012    PT Stop Time  1056    PT Time Calculation (min)  44 min    Activity Tolerance  Patient tolerated treatment well    Behavior During Therapy  [facility] for tasks assessed/performed       Past Medical History:  Diagnosis Date  . Colitis   . Glaucoma   . Lymphedema     Past Surgical History:  Procedure Laterality Date  . ANKLE SURGERY    . CATARACT EXTRACTION, BILATERAL    . eye injections      There were no vitals filed for this visit.  Subjective Assessment - 08/20/18 1025    Subjective  It is easier to to cross my Rt leg and put on my shoes.    Currently in Pain?  Yes    Pain Score  1     Pain Location  Hip    Pain Orientation  Left    Pain Descriptors / Indicators  Sore    Pain Type  Chronic pain    Pain Onset  More than a month ago    Pain Frequency  Intermittent    Aggravating Factors   weather related    Pain Relieving Factors  stretching         [facility] PT Assessment - 08/20/18 0001      Assessment   Medical Diagnosis  Rt leg muscle strain      Transfers   Five time sit to stand comments   14.69 seconds without UE support                   [facility] Adult PT Treatment/Exercise - 08/20/18 0001      Lumbar Exercises: Stretches   Figure 4 Stretch  2 reps;20 seconds;Seated;With overpressure      Lumbar Exercises: Aerobic   UBE (Upper Arm Bike)  Level 1x 5 minutes reverse      Lumbar Exercises: Standing   Row  Strengthening;Both;Theraband   2x15, VC for posture/core    Theraband Level (Row)  Level 2 (Red)    Shoulder Extension  Strengthening;Both;Theraband   2x15   Theraband Level (Shoulder Extension)  Level 2 (Red)      Knee/Hip Exercises: Aerobic   Nustep  Level 2 x 8 minutes    PTA present to discuss progress     Knee/Hip Exercises: Standing   Hip Abduction  Stengthening;Both;2 sets;10 reps    Hip Extension  Stengthening;Both;2 sets;10 reps    Other Standing Knee Exercises  standing on black balance pad: weight shifting 3 ways x 1.5 minutes each without UE suppport.        Knee/Hip Exercises: Seated   Sit to Sand  --   20x holding 5# wt from mat              PT Short Term Goals - 08/20/18 1026      PT SHORT TERM GOAL #1   Title  be independent in initial HEP  Status  Achieved      PT SHORT TERM GOAL #2   Title  demonstrate erect posture in sitting and standing and report corrections with activity    Status  Achieved      PT SHORT TERM GOAL #3   Title  improve 5x sit to stand to < or = to 20 seconds to improve balance and safety    Baseline  14.69    Status  Achieved      PT SHORT TERM GOAL #4   Title  initiate walking program and verbalize understanding of how to safely progress    Baseline  too hot    Time  8    Period  Weeks        PT Long Term Goals - 07/26/18 1434      PT LONG TERM GOAL #1   Title  be independent in advanced HEP    Time  8    Period  Weeks    Status  New    Target Date  09/20/18      PT LONG TERM GOAL #2   Title  reduce FOTO to < or = to 35% limitation    Time  8    Period  Weeks    Status  New    Target Date  09/20/18      PT LONG TERM GOAL #3   Title  perform 5x sit to stand in < or = to 14 seconds to improve safety and stability    Time  8    Period  Weeks    Status  New    Target Date  09/20/18      PT LONG TERM GOAL #4   Title  demonstrate 4/5 to 4+/5 bil hip strength to improve endurance for walking and community tasks    Time  8    Period  Weeks    Status  New     Target Date  09/20/18      PT LONG TERM GOAL #5   Title  demonstrate symmetry with ambulation with heel strike bilaterally    Time  8    Period  Weeks    Status  New    Target Date  09/20/18              Patient will benefit from skilled therapeutic intervention in order to improve the following deficits and impairments:     Visit Diagnosis: 1. Muscle weakness (generalized)   2. Other abnormalities of gait and mobility   3. Abnormal posture   4. Stiffness of left hip, not elsewhere classified   5. Pain in right leg        Problem List There are no active problems to display for this patient.    Sigurd Sos, PT 08/20/18 10:51 AM  [facility] [address], [place], [place], [address] Phone: [phone]   Fax:  [phone]  Name: Loveta Dellis MRN: [id number] Date of Birth: Feb 06, 1955

## 2018-08-24 ENCOUNTER — Other Ambulatory Visit: Payer: Self-pay

## 2018-08-24 ENCOUNTER — Encounter: Payer: Self-pay | Admitting: Physical Therapy

## 2018-08-24 ENCOUNTER — Ambulatory Visit: Payer: Medicare Other | Admitting: Physical Therapy

## 2018-08-24 DIAGNOSIS — R293 Abnormal posture: Secondary | ICD-10-CM

## 2018-08-24 DIAGNOSIS — M6281 Muscle weakness (generalized): Secondary | ICD-10-CM

## 2018-08-24 DIAGNOSIS — R2689 Other abnormalities of gait and mobility: Secondary | ICD-10-CM

## 2018-08-24 DIAGNOSIS — M79604 Pain in right leg: Secondary | ICD-10-CM

## 2018-08-24 DIAGNOSIS — M25652 Stiffness of left hip, not elsewhere classified: Secondary | ICD-10-CM

## 2018-08-24 NOTE — Therapy (Signed)
[facility] [address], [address] [place], [place], [address] Phone: [phone]   Fax:  [phone]  Physical Therapy Treatment  Patient Details  Name: Penny Hayes MRN: [id number] Date of Birth: 08/06/1955 Referring Provider (PT): Tracey HarriesBouska, David, MD   Encounter Date: 08/24/2018  PT End of Session - 08/24/18 1010    Visit Number  6    Date for PT Re-Evaluation  09/20/18    Authorization Type  Medicare    PT Start Time  1010    PT Stop Time  1050    PT Time Calculation (min)  40 min    Activity Tolerance  Patient tolerated treatment well    Behavior During Therapy  [facility] for tasks assessed/performed       Past Medical History:  Diagnosis Date  . Colitis   . Glaucoma   . Lymphedema     Past Surgical History:  Procedure Laterality Date  . ANKLE SURGERY    . CATARACT EXTRACTION, BILATERAL    . eye injections      There were no vitals filed for this visit.                    [facility] Adult PT Treatment/Exercise - 08/24/18 0001      Lumbar Exercises: Aerobic   UBE (Upper Arm Bike)  Level 1x 6 minutes  changing  direction each min   end of session, PTA present   Nustep  L1 x 5min PTa present to discuss status and monitor LT hip      Lumbar Exercises: Standing   Row  Strengthening;Both;10 reps;Theraband    Theraband Level (Row)  Level 3 (Green)    Shoulder Extension  Strengthening;Both;10 reps;Theraband    Theraband Level (Shoulder Extension)  Level 3 (Green)      Knee/Hip Exercises: Standing   Hip Abduction  Stengthening;Both;2 sets;10 reps;Knee straight   added 2# wts    Hip Extension  Stengthening;Both;2 sets;10 reps;Knee straight   2# wts added   Forward Step Up  Right;Left;1 set;10 reps;Hand Hold: 2;Step Height: 6"      Knee/Hip Exercises: Seated   Sit to Sand  --   20x holding 5# wt from mat              PT Short Term Goals - 08/20/18 1026      PT SHORT TERM GOAL #1   Title   be independent in initial HEP    Status  Achieved      PT SHORT TERM GOAL #2   Title  demonstrate erect posture in sitting and standing and report corrections with activity    Status  Achieved      PT SHORT TERM GOAL #3   Title  improve 5x sit to stand to < or = to 20 seconds to improve balance and safety    Baseline  14.69    Status  Achieved      PT SHORT TERM GOAL #4   Title  initiate walking program and verbalize understanding of how to safely progress    Baseline  too hot    Time  8    Period  Weeks        PT Long Term Goals - 07/26/18 1434      PT LONG TERM GOAL #1   Title  be independent in advanced HEP    Time  8    Period  Weeks    Status  New  Target Date  09/20/18      PT LONG TERM GOAL #2   Title  reduce FOTO to < or = to 35% limitation    Time  8    Period  Weeks    Status  New    Target Date  09/20/18      PT LONG TERM GOAL #3   Title  perform 5x sit to stand in < or = to 14 seconds to improve safety and stability    Time  8    Period  Weeks    Status  New    Target Date  09/20/18      PT LONG TERM GOAL #4   Title  demonstrate 4/5 to 4+/5 bil hip strength to improve endurance for walking and community tasks    Time  8    Period  Weeks    Status  New    Target Date  09/20/18      PT LONG TERM GOAL #5   Title  demonstrate symmetry with ambulation with heel strike bilaterally    Time  8    Period  Weeks    Status  New    Target Date  09/20/18            Plan - 08/24/18 1010    Clinical Impression Statement  Minor postural cuing throughout session today. Increased resistance to green band for postural ex today. This was challenging bt pt could do with good form. Pt remarks on how much steadier she feels on her feet, especially with stairs and sit to stand exercise.    Personal Factors and Comorbidities  Comorbidity 1;Comorbidity 2    Comorbidities  glaucoma with limited peripheral vision, Rt ankle surgery, Lt hip OA     Examination-Activity Limitations  Locomotion Level;Stand    Examination-Participation Restrictions  Community Activity    Stability/Clinical Decision Making  Evolving/Moderate complexity    Rehab Potential  Good    PT Frequency  2x / week    PT Duration  8 weeks    PT Treatment/Interventions  ADLs/Self Care Home Management;Gait training;Stair training;Functional mobility training;Therapeutic activities;Therapeutic exercise;Balance training;Neuromuscular re-education;Manual techniques;Patient/family education;Passive range of motion;Taping;Dry needling    PT Next Visit Plan  work on postural strength, LE strength, gait, balance, Lt hip ROM.    PT Home Exercise Plan  Access Code: [email or web address]    Consulted and Agree with Plan of Care  Patient       Patient will benefit from skilled therapeutic intervention in order to improve the following deficits and impairments:  Abnormal gait, Decreased activity tolerance, Decreased balance, Decreased mobility, Decreased endurance, Decreased range of motion, Decreased strength, Hypomobility, Impaired flexibility, Postural dysfunction  Visit Diagnosis: Other abnormalities of gait and mobility  Muscle weakness (generalized)  Abnormal posture  Stiffness of left hip, not elsewhere classified  Pain in right leg     Problem List There are no active problems to display for this patient.   Jamya Starry, PTA 08/24/2018, 10:44 AM  [facility] [address], [place], [place], [address] Phone: [phone]   Fax:  [phone]  Name: Penny Hayes MRN: [id number] Date of Birth: July 12, 1955

## 2018-08-28 ENCOUNTER — Other Ambulatory Visit: Payer: Self-pay

## 2018-08-28 ENCOUNTER — Ambulatory Visit: Payer: Medicare Other

## 2018-08-28 DIAGNOSIS — M6281 Muscle weakness (generalized): Secondary | ICD-10-CM | POA: Diagnosis not present

## 2018-08-28 DIAGNOSIS — M25652 Stiffness of left hip, not elsewhere classified: Secondary | ICD-10-CM

## 2018-08-28 DIAGNOSIS — R2689 Other abnormalities of gait and mobility: Secondary | ICD-10-CM

## 2018-08-28 DIAGNOSIS — M79604 Pain in right leg: Secondary | ICD-10-CM

## 2018-08-28 DIAGNOSIS — R293 Abnormal posture: Secondary | ICD-10-CM

## 2018-08-28 NOTE — Therapy (Signed)
[facility] [address], [address] [place], [place], [address] Phone: [phone]   Fax:  [phone]  Physical Therapy Treatment  Patient Details  Name: Penny Hayes MRN: [id number] Date of Birth: 01/19/1955 Referring Provider (PT): Tracey HarriesBouska, David, MD   Encounter Date: 08/28/2018  PT End of Session - 08/28/18 1037    Visit Number  7    Date for PT Re-Evaluation  09/20/18    Authorization Type  Medicare    PT Start Time  1001    PT Stop Time  1041    PT Time Calculation (min)  40 min    Activity Tolerance  Patient tolerated treatment well    Behavior During Therapy  [facility] for tasks assessed/performed       Past Medical History:  Diagnosis Date  . Colitis   . Glaucoma   . Lymphedema     Past Surgical History:  Procedure Laterality Date  . ANKLE SURGERY    . CATARACT EXTRACTION, BILATERAL    . eye injections      There were no vitals filed for this visit.  Subjective Assessment - 08/28/18 1005    Subjective  I was so busy with work yesterday that I didn't have time to do my exercises.  I could really tell that I didn't do them.    Currently in Pain?  No/denies                       [facility] - 08/28/18 0001      Lumbar Exercises: Aerobic   UBE (Upper Arm Bike)  Level 1x 6 minutes  changing  direction each min   end of session, PTA present   Nustep  L2 x 8 minutes-PT  present to discuss status and monitor LT hip      Lumbar Exercises: Standing   Row  Strengthening;Both;10 reps;Theraband    Theraband Level (Row)  Level 3 (Green)    Row Limitations  2nd set on black pad    Shoulder Extension  Strengthening;Both;10 reps;Theraband    Theraband Level (Shoulder Extension)  Level 3 (Green)    Shoulder Extension Limitations  2nd set on black pad    Other Standing Lumbar Exercises  standing on black pad: red ball diagonals x 10 each      Knee/Hip Exercises: Standing   Hip  Abduction  Stengthening;Both;2 sets;10 reps;Knee straight   added 2# wts 2nd set on balance pad   Hip Extension  Stengthening;Both;2 sets;10 reps;Knee straight   2# wts added.  2nd set on balance pad   Other Standing Knee Exercises  standing on black balance pad: weight shifting 3 ways x 1.5 minutes each without UE suppport.                 PT Short Term Goals - 08/28/18 1006      PT SHORT TERM GOAL #4   Title  initiate walking program and verbalize understanding of how to safely progress    Baseline  I have been able to do this    Status  Achieved        PT Long Term Goals - 07/26/18 1434      PT LONG TERM GOAL #1   Title  be independent in advanced HEP    Time  8    Period  Weeks    Status  New    Target Date  09/20/18      PT  LONG TERM GOAL #2   Title  reduce FOTO to < or = to 35% limitation    Time  8    Period  Weeks    Status  New    Target Date  09/20/18      PT LONG TERM GOAL #3   Title  perform 5x sit to stand in < or = to 14 seconds to improve safety and stability    Time  8    Period  Weeks    Status  New    Target Date  09/20/18      PT LONG TERM GOAL #4   Title  demonstrate 4/5 to 4+/5 bil hip strength to improve endurance for walking and community tasks    Time  8    Period  Weeks    Status  New    Target Date  09/20/18      PT LONG TERM GOAL #5   Title  demonstrate symmetry with ambulation with heel strike bilaterally    Time  8    Period  Weeks    Status  New    Target Date  09/20/18            Plan - 08/28/18 1011    Clinical Impression Statement  Pt has been able to initiate a walking program and is able to complete 20 minute walks without increased pain.  Pt is making steady progress with exercise progression and has been able to advance both resistance and challenge of exercise over the past weeks.  Pt denies any leg pain over the past 2 weeks.  Pt with increased ease with sit to stand without UE support.  Pt will continue to  benefit from skilled PT to continue to advance strength, endurance and balance progression to improve function and safety at home and in the community.    PT Frequency  2x / week    PT Duration  8 weeks    PT Treatment/Interventions  ADLs/Self Care Home Management;Gait training;Stair training;Functional mobility training;Therapeutic activities;Therapeutic exercise;Balance training;Neuromuscular re-education;Manual techniques;Patient/family education;Passive range of motion;Taping;Dry needling    PT Next Visit Plan  work on postural strength, LE strength, gait, balance, Lt hip ROM.    PT Home Exercise Plan  Access Code: [email or web address]    Recommended Other Services  initial certification is signed    Consulted and Agree with Plan of Care  Patient       Patient will benefit from skilled therapeutic intervention in order to improve the following deficits and impairments:  Abnormal gait, Decreased activity tolerance, Decreased balance, Decreased mobility, Decreased endurance, Decreased range of motion, Decreased strength, Hypomobility, Impaired flexibility, Postural dysfunction  Visit Diagnosis: Other abnormalities of gait and mobility  Muscle weakness (generalized)  Abnormal posture  Stiffness of left hip, not elsewhere classified  Pain in right leg     Problem List There are no active problems to display for this patient.   Sigurd Sos, PT 08/28/18 10:39 AM  [facility] [address], [place], [place], [address] Phone: [phone]   Fax:  [phone]  Name: Marjean Imperato MRN: [id number] Date of Birth: 01-04-1955

## 2018-08-31 ENCOUNTER — Encounter: Payer: Self-pay | Admitting: Physical Therapy

## 2018-08-31 ENCOUNTER — Other Ambulatory Visit: Payer: Self-pay

## 2018-08-31 ENCOUNTER — Ambulatory Visit: Payer: Medicare Other | Admitting: Physical Therapy

## 2018-08-31 DIAGNOSIS — R293 Abnormal posture: Secondary | ICD-10-CM

## 2018-08-31 DIAGNOSIS — M6281 Muscle weakness (generalized): Secondary | ICD-10-CM

## 2018-08-31 DIAGNOSIS — M79604 Pain in right leg: Secondary | ICD-10-CM

## 2018-08-31 DIAGNOSIS — R2689 Other abnormalities of gait and mobility: Secondary | ICD-10-CM

## 2018-08-31 DIAGNOSIS — M25652 Stiffness of left hip, not elsewhere classified: Secondary | ICD-10-CM

## 2018-08-31 NOTE — Therapy (Signed)
[facility] [address] [place], [place], [address] Phone: [phone]   Fax:  [phone]  Physical Therapy Treatment  Patient Details  Name: Penny Hayes MRN: [id number] Date of Birth: 15-Sep-1955 Referring Provider (PT): Bernerd Limbo, MD   Encounter Date: 08/31/2018  PT End of Session - 08/31/18 1019    Visit Number  8    Date for PT Re-Evaluation  09/20/18    Authorization Type  Medicare    PT Start Time  1016    PT Stop Time  1054    PT Time Calculation (min)  38 min    Activity Tolerance  Patient tolerated treatment well    Behavior During Therapy  [facility] for tasks assessed/performed       Past Medical History:  Diagnosis Date  . Colitis   . Glaucoma   . Lymphedema     Past Surgical History:  Procedure Laterality Date  . ANKLE SURGERY    . CATARACT EXTRACTION, BILATERAL    . eye injections      There were no vitals filed for this visit.  Subjective Assessment - 08/31/18 1020    Subjective  I have been standing up taller all week. I feel like I look at world differently now. No pain today.    Pertinent History  Lt LE lympadema, glaucoma with peripheral vision loss    Currently in Pain?  No/denies    Multiple Pain Sites  No                       [facility] Adult PT Treatment/Exercise - 08/31/18 0001      Lumbar Exercises: Aerobic   UBE (Upper Arm Bike)  L1 3x3 with lumbar roll. PTa discussed current status.     Nustep  L2 x 8 minutes-PT A present to monitor hip.      Lumbar Exercises: Standing   Row  Strengthening;Both;15 reps;Theraband   2 sets   Theraband Level (Row)  Level 3 (Green)    Shoulder Extension  Strengthening;Both;15 reps;Theraband   2sets   Theraband Level (Shoulder Extension)  Level 3 (Green)      Knee/Hip Exercises: Standing   Knee Flexion  Strengthening;Both;2 sets;10 reps    Knee Flexion Limitations  2.5#    Hip Abduction  Stengthening;Both;2 sets;10  reps;Knee straight   added 2.5# wts on balance pad   Hip Extension  Stengthening;Both;2 sets;10 reps;Knee straight   2.5# wts added. On balance pad     Knee/Hip Exercises: Seated   Sit to Sand  2 sets;10 reps;without UE support   holding 6# wt              PT Short Term Goals - 08/28/18 1006      PT SHORT TERM GOAL #4   Title  initiate walking program and verbalize understanding of how to safely progress    Baseline  I have been able to do this    Status  Achieved        PT Long Term Goals - 07/26/18 1434      PT LONG TERM GOAL #1   Title  be independent in advanced HEP    Time  8    Period  Weeks    Status  New    Target Date  09/20/18      PT LONG TERM GOAL #2   Title  reduce FOTO to < or = to 35% limitation    Time  8    Period  Weeks    Status  New    Target Date  09/20/18      PT LONG TERM GOAL #3   Title  perform 5x sit to stand in < or = to 14 seconds to improve safety and stability    Time  8    Period  Weeks    Status  New    Target Date  09/20/18      PT LONG TERM GOAL #4   Title  demonstrate 4/5 to 4+/5 bil hip strength to improve endurance for walking and community tasks    Time  8    Period  Weeks    Status  New    Target Date  09/20/18      PT LONG TERM GOAL #5   Title  demonstrate symmetry with ambulation with heel strike bilaterally    Time  8    Period  Weeks    Status  New    Target Date  09/20/18            Plan - 08/31/18 1019    Clinical Impression Statement  Verbal cues mainly on hip abduction LT to keep leg straight vs rotating. Pt demonstrating improved postural awreness and strength/endurance. Pt reports "80% improved since day one.    Personal Factors and Comorbidities  Comorbidity 1;Comorbidity 2    Comorbidities  glaucoma with limited peripheral vision, Rt ankle surgery, Lt hip OA    Examination-Activity Limitations  Locomotion Level;Stand    Examination-Participation Restrictions  Community Activity     Stability/Clinical Decision Making  Evolving/Moderate complexity    Rehab Potential  Good    PT Frequency  2x / week    PT Duration  8 weeks    PT Treatment/Interventions  ADLs/Self Care Home Management;Gait training;Stair training;Functional mobility training;Therapeutic activities;Therapeutic exercise;Balance training;Neuromuscular re-education;Manual techniques;Patient/family education;Passive range of motion;Taping;Dry needling    PT Next Visit Plan  work on postural strength, LE strength, gait, balance, Lt hip ROM.    PT Home Exercise Plan  Access Code: [email or web address]    Consulted and Agree with Plan of Care  Patient       Patient will benefit from skilled therapeutic intervention in order to improve the following deficits and impairments:  Abnormal gait, Decreased activity tolerance, Decreased balance, Decreased mobility, Decreased endurance, Decreased range of motion, Decreased strength, Hypomobility, Impaired flexibility, Postural dysfunction  Visit Diagnosis: Other abnormalities of gait and mobility  Muscle weakness (generalized)  Abnormal posture  Stiffness of left hip, not elsewhere classified  Pain in right leg     Problem List There are no active problems to display for this patient.   Rube Sanchez, PTA 08/31/2018, 10:54 AM  [facility] [address], [address] [place], [place], [address] Phone: [phone]   Fax:  [phone]  Name: Penny Hayes MRN: [id number] Date of Birth: 09/27/1955

## 2018-09-04 ENCOUNTER — Other Ambulatory Visit: Payer: Self-pay

## 2018-09-04 ENCOUNTER — Ambulatory Visit: Payer: Medicare Other | Attending: Family Medicine

## 2018-09-04 DIAGNOSIS — M25652 Stiffness of left hip, not elsewhere classified: Secondary | ICD-10-CM | POA: Diagnosis present

## 2018-09-04 DIAGNOSIS — R2689 Other abnormalities of gait and mobility: Secondary | ICD-10-CM | POA: Insufficient documentation

## 2018-09-04 DIAGNOSIS — M6281 Muscle weakness (generalized): Secondary | ICD-10-CM | POA: Diagnosis present

## 2018-09-04 DIAGNOSIS — R293 Abnormal posture: Secondary | ICD-10-CM | POA: Diagnosis present

## 2018-09-04 DIAGNOSIS — M79604 Pain in right leg: Secondary | ICD-10-CM | POA: Insufficient documentation

## 2018-09-04 NOTE — Therapy (Signed)
[facility] [address], [address] [place], [place], [address] Phone: [phone]   Fax:  [phone]  Physical Therapy Treatment  Patient Details  Name: Penny Hayes MRN: [id number] Date of Birth: 03/09/1955 Referring Provider (PT): Tracey HarriesBouska, David, MD   Encounter Date: 09/04/2018 Progress Note Reporting Period  07/26/2018 to 09/04/2018  See note below for Objective Data and Assessment of Progress/Goals.      PT End of Session - 09/04/18 1046    Visit Number  9    Date for PT Re-Evaluation  09/20/18    Authorization Type  Medicare    PT Start Time  1001    PT Stop Time  1043    PT Time Calculation (min)  42 min    Activity Tolerance  Patient tolerated treatment well    Behavior During Therapy  [facility] for tasks assessed/performed       Past Medical History:  Diagnosis Date  . Colitis   . Glaucoma   . Lymphedema     Past Surgical History:  Procedure Laterality Date  . ANKLE SURGERY    . CATARACT EXTRACTION, BILATERAL    . eye injections      There were no vitals filed for this visit.  Subjective Assessment - 09/04/18 1004    Subjective  I'm doing well.    Currently in Pain?  No/denies         [facility] - 09/04/18 0001      Assessment   Medical Diagnosis  Rt leg muscle strain    Onset Date/Surgical Date  05/15/18      Observation/Other Assessments   Focus on Therapeutic Outcomes (FOTO)   38% limited      Transfers   Five time sit to stand comments   11.98 without UE support                   [facility] Adult PT Treatment/Exercise - 09/04/18 0001      Lumbar Exercises: Aerobic   Nustep  L2 x 8 minutes-PT present to discuss progress      Lumbar Exercises: Standing   Row  Strengthening;Both;15 reps;Theraband   2 sets   Theraband Level (Row)  Level 3 (Green)    Shoulder Extension  Strengthening;Both;15 reps;Theraband   2sets   Theraband Level (Shoulder Extension)  Level 3 (Green)    Other Standing Lumbar Exercises  standing on black pad: red ball diagonals x 10 each      Knee/Hip Exercises: Standing   Knee Flexion  Strengthening;Both;2 sets;10 reps    Knee Flexion Limitations  2.5# on black pad    Hip Abduction  Stengthening;Both;2 sets;10 reps;Knee straight   added 2.5# wts on balance pad   Abduction Limitations  on black pad    Hip Extension  Stengthening;Both;2 sets;10 reps;Knee straight   2.5# wts added. On balance pad   Extension Limitations  on black pad    Rebounder  weight shifting 3 ways: 1 minute each      Knee/Hip Exercises: Seated   Sit to Sand  2 sets;10 reps;without UE support   holding 6# wt              PT Short Term Goals - 08/28/18 1006      PT SHORT TERM GOAL #4   Title  initiate walking program and verbalize understanding of how to safely progress    Baseline  I have been able to do this    Status  Achieved        PT Long Term Goals - 09/04/18 1006      PT LONG TERM GOAL #1   Title  be independent in advanced HEP    Baseline  further advancementn needed    Time  8    Period  Weeks    Status  On-going      PT LONG TERM GOAL #2   Title  reduce FOTO to < or = to 35% limitation    Baseline  38% limitation    Time  8    Period  Weeks    Status  On-going      PT LONG TERM GOAL #3   Title  perform 5x sit to stand in < or = to 14 seconds to improve safety and stability    Baseline  11 seconds    Status  Achieved      PT LONG TERM GOAL #5   Title  demonstrate symmetry with ambulation with heel strike bilaterally    Status  Achieved            Plan - 09/04/18 1042    Clinical Impression Statement  Pt is making steady progress s/p Rt LE injury when she stepped into a hole.  Pt will have x-ray of Lt hip this week.  Pt got a note to go to the gym and will look into a membership in the next week.  PT advised pt to schedule 1-2 more sessions and we will D/C to HEP and gym exercise.  FOTO is improved to 38% limitation  (48% limitation at evaluation).  5x sit to stand is 11.98 seconds indicating a lower risk of falls.  Pt will attend 2-3 more sessions before D/C to HEP.    PT Treatment/Interventions  ADLs/Self Care Home Management;Gait training;Stair training;Functional mobility training;Therapeutic activities;Therapeutic exercise;Balance training;Neuromuscular re-education;Manual techniques;Patient/family education;Passive range of motion;Taping;Dry needling    PT Next Visit Plan  2-3 more sessions.  Continue to address balance, strength, endurance and build HEP/gym program    PT Home Exercise Plan  Access Code: [email or web address]    Consulted and Agree with Plan of Care  Patient       Patient will benefit from skilled therapeutic intervention in order to improve the following deficits and impairments:  Abnormal gait, Decreased activity tolerance, Decreased balance, Decreased mobility, Decreased endurance, Decreased range of motion, Decreased strength, Hypomobility, Impaired flexibility, Postural dysfunction  Visit Diagnosis: Other abnormalities of gait and mobility  Muscle weakness (generalized)  Abnormal posture  Stiffness of left hip, not elsewhere classified  Pain in right leg     Problem List There are no active problems to display for this patient.   Sigurd Sos, PT 09/04/18 10:49 AM  [facility] [address] [place], [place], [address] Phone: [phone]   Fax:  [phone]  Name: Asees Manfredi MRN: [id number] Date of Birth: 1955/12/05

## 2018-09-07 ENCOUNTER — Ambulatory Visit: Payer: Medicare Other | Admitting: Physical Therapy

## 2018-09-07 ENCOUNTER — Other Ambulatory Visit: Payer: Self-pay

## 2018-09-07 ENCOUNTER — Encounter: Payer: Self-pay | Admitting: Physical Therapy

## 2018-09-07 DIAGNOSIS — R2689 Other abnormalities of gait and mobility: Secondary | ICD-10-CM

## 2018-09-07 DIAGNOSIS — M6281 Muscle weakness (generalized): Secondary | ICD-10-CM

## 2018-09-07 DIAGNOSIS — M25652 Stiffness of left hip, not elsewhere classified: Secondary | ICD-10-CM

## 2018-09-07 DIAGNOSIS — R293 Abnormal posture: Secondary | ICD-10-CM

## 2018-09-07 DIAGNOSIS — M79604 Pain in right leg: Secondary | ICD-10-CM

## 2018-09-07 NOTE — Therapy (Signed)
[facility] [address] [place], [place], [address] Phone: [phone]   Fax:  [phone]  Physical Therapy Treatment  Patient Details  Name: Penny Hayes MRN: [id number] Date of Birth: May 12, 1955 Referring Provider (PT): Bernerd Limbo, MD   Encounter Date: 09/07/2018  PT End of Session - 09/07/18 1025    Visit Number  10    Date for PT Re-Evaluation  09/20/18    Authorization Type  Medicare    PT Start Time  1019    PT Stop Time  1100    PT Time Calculation (min)  41 min    Activity Tolerance  --    Behavior During Therapy  [facility] for tasks assessed/performed       Past Medical History:  Diagnosis Date  . Colitis   . Glaucoma   . Lymphedema     Past Surgical History:  Procedure Laterality Date  . ANKLE SURGERY    . CATARACT EXTRACTION, BILATERAL    . eye injections      There were no vitals filed for this visit.  Subjective Assessment - 09/07/18 1027    Subjective  Drove to [facility] for appt the other day and my RT knee swelled up quite a bit. It was better the next day.    Pertinent History  Lt LE lympadema, glaucoma with peripheral vision loss    Limitations  Standing;Walking    Currently in Pain?  No/denies    Multiple Pain Sites  No                       [facility] Adult PT Treatment/Exercise - 09/07/18 0001      Lumbar Exercises: Aerobic   UBE (Upper Arm Bike)  L1 3x3 with lumbar roll    Nustep  L2 x 8 minutes-PTA present to discuss progress      Lumbar Exercises: Standing   Row  Strengthening;Both;20 reps;Theraband   2 sets   Theraband Level (Row)  Level 3 (Green)    Row Limitations  VC to slow speed    Shoulder Extension  Strengthening;Both;15 reps;Theraband   2sets   Theraband Level (Shoulder Extension)  Level 3 (Green)    Shoulder Extension Limitations  Pt declined increasing  reps    Other Standing Lumbar Exercises  standing on black pad: red ball diagonals x 10 each      Knee/Hip Exercises: Standing   Hip Abduction  Stengthening;Both;2 sets;10 reps;Knee straight   added 2.5# wts on balance pad   Abduction Limitations  on black pad    Hip Extension  Stengthening;Both;2 sets;10 reps;Knee straight   2.5# wts added. On balance pad   Extension Limitations  on black pad    Rebounder  weight shifting 3 ways: 1 minute each   No UE     Knee/Hip Exercises: Seated   Sit to Sand  2 sets;10 reps;without UE support   holding 6# wt              PT Short Term Goals - 08/28/18 1006      PT SHORT TERM GOAL #4   Title  initiate walking program and verbalize understanding of how to safely progress    Baseline  I have been able to do this    Status  Achieved        PT Long Term Goals - 09/04/18 1006      PT LONG TERM GOAL #1   Title  be independent  in advanced HEP    Baseline  further advancementn needed    Time  8    Period  Weeks    Status  On-going      PT LONG TERM GOAL #2   Title  reduce FOTO to < or = to 35% limitation    Baseline  38% limitation    Time  8    Period  Weeks    Status  On-going      PT LONG TERM GOAL #3   Title  perform 5x sit to stand in < or = to 14 seconds to improve safety and stability    Baseline  11 seconds    Status  Achieved      PT LONG TERM GOAL #5   Title  demonstrate symmetry with ambulation with heel strike bilaterally    Status  Achieved            Plan - 09/07/18 1025    Clinical Impression Statement  Pt performed all exercises well, very minimal verbal cues for posture or control as pt has learned very well how to self manage herself. Pt did report RT knee swelling post drive to [facility] for MD appt. This improved overnight. Pt reports hse has plas to continue exercising at the [facility] by joinig an [employer] program.    Personal Factors and Comorbidities  Comorbidity 1;Comorbidity 2    Comorbidities  glaucoma with limited peripheral vision, Rt ankle surgery, Lt hip OA    Examination-Activity  Limitations  Locomotion Level;Stand    Examination-Participation Restrictions  Community Activity    Stability/Clinical Decision Making  Evolving/Moderate complexity    Rehab Potential  Good    PT Frequency  2x / week    PT Duration  8 weeks    PT Treatment/Interventions  ADLs/Self Care Home Management;Gait training;Stair training;Functional mobility training;Therapeutic activities;Therapeutic exercise;Balance training;Neuromuscular re-education;Manual techniques;Patient/family education;Passive range of motion;Taping;Dry needling    PT Next Visit Plan  DC next 2 visits per PT POC    PT Home Exercise Plan  Access Code: [id number]    Consulted and Agree with Plan of Care  Patient       Patient will benefit from skilled therapeutic intervention in order to improve the following deficits and impairments:  Abnormal gait, Decreased activity tolerance, Decreased balance, Decreased mobility, Decreased endurance, Decreased range of motion, Decreased strength, Hypomobility, Impaired flexibility, Postural dysfunction  Visit Diagnosis: Other abnormalities of gait and mobility  Muscle weakness (generalized)  Abnormal posture  Stiffness of left hip, not elsewhere classified  Pain in right leg     Problem List There are no active problems to display for this patient.   Penny Hayes, PTA 09/07/2018, 10:59 AM  [facility] [address], [address] [place], [place], [address] Phone: [phone]   Fax:  [phone]  Name: Penny Hayes MRN: [id number] Date of Birth: 05/24/1955

## 2018-09-13 ENCOUNTER — Ambulatory Visit: Payer: Medicare Other

## 2018-09-13 ENCOUNTER — Other Ambulatory Visit: Payer: Self-pay

## 2018-09-13 DIAGNOSIS — M79604 Pain in right leg: Secondary | ICD-10-CM

## 2018-09-13 DIAGNOSIS — R2689 Other abnormalities of gait and mobility: Secondary | ICD-10-CM | POA: Diagnosis not present

## 2018-09-13 DIAGNOSIS — M25652 Stiffness of left hip, not elsewhere classified: Secondary | ICD-10-CM

## 2018-09-13 DIAGNOSIS — M6281 Muscle weakness (generalized): Secondary | ICD-10-CM

## 2018-09-13 DIAGNOSIS — R293 Abnormal posture: Secondary | ICD-10-CM

## 2018-09-13 NOTE — Therapy (Signed)
[facility] [address], [address] [place], [place], [address] Phone: [phone]   Fax:  [phone]  Physical Therapy Treatment  Patient Details  Name: Penny Hayes MRN: [id number] Date of Birth: 01/25/1955 Referring Provider (PT): Tracey HarriesBouska, David, MD   Encounter Date: 09/13/2018  PT End of Session - 09/13/18 0855    Visit Number  11    Date for PT Re-Evaluation  09/20/18    Authorization Type  Medicare    PT Start Time  0815    PT Stop Time  0859    PT Time Calculation (min)  44 min    Activity Tolerance  Patient tolerated treatment well    Behavior During Therapy  [facility] for [facility] assessed/performed       Past Medical History:  Diagnosis Date  . Colitis   . Glaucoma   . Lymphedema     Past Surgical History:  Procedure Laterality Date  . ANKLE SURGERY    . CATARACT EXTRACTION, BILATERAL    . eye injections      There were no vitals filed for this visit.  Subjective Assessment - 09/13/18 0823    Subjective  I am no longer having Rt knee edema.  My hip has been a little more sore due to weather changes.    Currently in Pain?  No/denies   Stiff                      [facility] Adult PT Treatment/Exercise - 09/13/18 0001      Lumbar Exercises: Aerobic   UBE (Upper Arm Bike)  L1 3x3 with lumbar roll    Nustep  L3 x 8 minutes-PT present to discuss progress      Lumbar Exercises: Standing   Row  Strengthening;Both;20 reps;Theraband   2 sets   Theraband Level (Row)  Level 3 (Green)    Row Limitations  verbal cues for speed    Shoulder Extension  Strengthening;Both;15 reps;Theraband   2sets   Theraband Level (Shoulder Extension)  Level 3 (Green)    Other Standing Lumbar Exercises  standing on black pad: red ball diagonals x 10 each      Knee/Hip Exercises: Standing   Hip Abduction  Stengthening;Both;2 sets;10 reps;Knee straight   added 2.5# wts on balance pad   Abduction Limitations  on black pad     Hip Extension  Stengthening;Both;2 sets;10 reps;Knee straight   2.5# wts added. On balance pad   Extension Limitations  on black pad    Rebounder  weight shifting 3 ways: 1 minute each   No UE     Knee/Hip Exercises: Seated   Sit to Sand  2 sets;10 reps;without UE support   holding 10# wt              PT Short Term Goals - 08/28/18 1006      PT SHORT TERM GOAL #4   Title  initiate walking program and verbalize understanding of how to safely progress    Baseline  I have been able to do this    Status  Achieved        PT Long Term Goals - 09/13/18 0827      PT LONG TERM GOAL #5   Title  demonstrate symmetry with ambulation with heel strike bilaterally    Status  Achieved            Plan - 09/13/18 0834    Clinical Impression Statement  Pt performed  all exercises well, very minimal verbal cues for posture or control as pt has learned very well how to self manage this herself. Pt requires verbal cues for speed with standing theraband exercises. Pt demonstrates improved stability with balance exercises and requires reduced guarding by PT. Pt plans to continue exercising at the [facility] by joining an [employer] after discharge. 1 more session to finalize HEP.    PT Frequency  2x / week    PT Duration  8 weeks    PT Treatment/Interventions  ADLs/Self Care Home Management;Gait training;Stair training;Functional mobility training;Therapeutic activities;Therapeutic exercise;Balance training;Neuromuscular re-education;Manual techniques;Patient/family education;Passive range of motion;Taping;Dry needling    PT Next Visit Plan  1 more session    PT Home Exercise Plan  Access Code: [email or web address]    Consulted and Agree with Plan of Care  Patient       Patient will benefit from skilled therapeutic intervention in order to improve the following deficits and impairments:  Abnormal gait, Decreased activity tolerance, Decreased balance, Decreased mobility, Decreased  endurance, Decreased range of motion, Decreased strength, Hypomobility, Impaired flexibility, Postural dysfunction  Visit Diagnosis: Muscle weakness (generalized)  Abnormal posture  Other abnormalities of gait and mobility  Stiffness of left hip, not elsewhere classified  Pain in right leg     Problem List There are no active problems to display for this patient.   Penny Hayes, PT 09/13/18 8:56 AM   [facility] [address], [place], [place], [address] Phone: [phone]   Fax:  [phone]  Name: Penny Hayes MRN: [id number] Date of Birth: Apr 21, 1955

## 2018-09-18 ENCOUNTER — Ambulatory Visit: Payer: Medicare Other

## 2018-09-18 ENCOUNTER — Other Ambulatory Visit: Payer: Self-pay

## 2018-09-18 DIAGNOSIS — M79604 Pain in right leg: Secondary | ICD-10-CM

## 2018-09-18 DIAGNOSIS — R2689 Other abnormalities of gait and mobility: Secondary | ICD-10-CM | POA: Diagnosis not present

## 2018-09-18 DIAGNOSIS — R293 Abnormal posture: Secondary | ICD-10-CM

## 2018-09-18 DIAGNOSIS — M6281 Muscle weakness (generalized): Secondary | ICD-10-CM

## 2018-09-18 DIAGNOSIS — M25652 Stiffness of left hip, not elsewhere classified: Secondary | ICD-10-CM

## 2018-09-18 NOTE — Therapy (Signed)
[facility] [address] [place], [place], [address] Phone: [phone]   Fax:  [phone]  Physical Therapy Treatment  Patient Details  Name: Penny Hayes MRN: [id number] Date of Birth: [birth year] Referring Provider (PT): Bernerd Limbo, MD   Encounter Date: 09/18/2018  PT End of Session - 09/18/18 0935    Visit Number  12    Authorization Type  Medicare    PT Start Time  0900    PT Stop Time  0941    PT Time Calculation (min)  41 min    Activity Tolerance  Patient tolerated treatment well    Behavior During Therapy  [facility] for tasks assessed/performed       Past Medical History:  Diagnosis Date  . Colitis   . Glaucoma   . Lymphedema     Past Surgical History:  Procedure Laterality Date  . ANKLE SURGERY    . CATARACT EXTRACTION, BILATERAL    . eye injections      There were no vitals filed for this visit.  Subjective Assessment - 09/18/18 0908    Subjective  I am ready for D/C.  I am doing my exercises and plan to go to the gym.    Currently in Pain?  No/denies         [facility] PT Assessment - 09/18/18 0001      Assessment   Medical Diagnosis  Rt leg muscle strain    Referring Provider (PT)  Bernerd Limbo, MD    Onset Date/Surgical Date  05/15/18      Observation/Other Assessments   Focus on Therapeutic Outcomes (FOTO)   28% limitation      Strength   Right Hip Flexion  4+/5    Right Hip External Rotation   4+/5    Right Hip Internal Rotation  4+/5    Right Hip ABduction  4+/5    Right Hip ADduction  4+/5    Left Hip Flexion  4+/5    Left Hip External Rotation  4+/5    Left Hip Internal Rotation  4+/5    Left Hip ABduction  4+/5    Left Hip ADduction  4+/5      Transfers   Five time sit to stand comments   12.3 without UE support                   [facility] Adult PT Treatment/Exercise - 09/18/18 0001      Lumbar Exercises: Aerobic   UBE (Upper Arm Bike)  L1.5 3x3 with lumbar  roll    Nustep  L3 x 8 minutes-PT present to discuss progress      Lumbar Exercises: Standing   Row  Strengthening;Both;20 reps;Theraband   2 sets   Theraband Level (Row)  Level 3 (Green)    Shoulder Extension  Strengthening;Both;15 reps;Theraband   2sets   Theraband Level (Shoulder Extension)  Level 3 (Green)    Other Standing Lumbar Exercises  standing on black pad: red ball diagonals x 10 each      Knee/Hip Exercises: Standing   Hip Abduction  Stengthening;Both;2 sets;10 reps;Knee straight   added 2.5# wts on balance pad   Abduction Limitations  on black pad    Hip Extension  Stengthening;Both;2 sets;10 reps;Knee straight   2.5# wts added. On balance pad   Extension Limitations  on black pad    Rebounder  weight shifting 3 ways: 1 minute each   No UE  PT Short Term Goals - 08/28/18 1006      PT SHORT TERM GOAL #4   Title  initiate walking program and verbalize understanding of how to safely progress    Baseline  I have been able to do this    Status  Achieved        PT Long Term Goals - 09/18/18 0913      PT LONG TERM GOAL #1   Title  be independent in advanced HEP    Baseline  --    Status  Achieved      PT LONG TERM GOAL #2   Title  reduce FOTO to < or = to 35% limitation    Baseline  28% limitation    Status  Achieved      PT LONG TERM GOAL #3   Title  perform 5x sit to stand in < or = to 14 seconds to improve safety and stability    Baseline  11 seconds    Status  Achieved      PT LONG TERM GOAL #4   Title  demonstrate 4/5 to 4+/5 bil hip strength to improve endurance for walking and community tasks    Status  Achieved      PT LONG TERM GOAL #5   Title  demonstrate symmetry with ambulation with heel strike bilaterally    Status  Achieved            Plan - 09/18/18 0926    Clinical Impression Statement  Pt reports 90% overall improvement since the start of care.  Bil hip strength has improved and pt is able to perform 5x sit  to stand in 12 seconds indicating a reduced falls risk.  FOTO is improved to 28% limitation.  Pt has met all goals and will transition to HEP and gym exercises including aquatics.  Pt will D/C.    PT Next Visit Plan  D/C PT to HEP    PT Home Exercise Plan  Access Code: [email or web address]    Consulted and Agree with Plan of Care  Patient       Patient will benefit from skilled therapeutic intervention in order to improve the following deficits and impairments:     Visit Diagnosis: Muscle weakness (generalized)  Abnormal posture  Other abnormalities of gait and mobility  Stiffness of left hip, not elsewhere classified  Pain in right leg     Problem List There are no active problems to display for this patient.  PHYSICAL THERAPY DISCHARGE SUMMARY  Visits from Start of Care: 12  Current functional level related to goals / functional outcomes: See above for current status.    Remaining deficits: No deficits remain at this time.    Education / Equipment: HEP, fall prevention Plan: Patient agrees to discharge.  Patient goals were met. Patient is being discharged due to meeting the stated rehab goals.  ?????        Sigurd Sos, PT 09/18/18 9:37 AM  [facility] [address], [place], [place], [address] Phone: [phone]   Fax:  [phone]  Name: Penny Hayes MRN: [id number] Date of Birth: [birth year]

## 2018-10-02 ENCOUNTER — Encounter: Payer: Self-pay | Admitting: Gynecology

## 2021-12-09 ENCOUNTER — Ambulatory Visit: Payer: Medicare Other | Admitting: Podiatry

## 2021-12-09 VITALS — BP 133/59 | HR 68

## 2021-12-09 DIAGNOSIS — Z79899 Other long term (current) drug therapy: Secondary | ICD-10-CM | POA: Diagnosis not present

## 2021-12-09 DIAGNOSIS — B351 Tinea unguium: Secondary | ICD-10-CM | POA: Diagnosis not present

## 2021-12-09 DIAGNOSIS — L84 Corns and callosities: Secondary | ICD-10-CM

## 2021-12-09 NOTE — Progress Notes (Signed)
Subjective:   Patient ID: Penny Hayes, female   DOB: 66 y.o.   MRN: [id number]   HPI Chief Complaint  Patient presents with   Ingrown Toenail    Patient came in today for ingrown toenail on the left foot hallux, left foot callus,     66 year old female presents the office today with above concerns.  She previously had an ingrown toenail procedrue on the left big toe several years ago. The nails are chaing chape and also getting ticker. She gets peiccures. On the right side about 2 months ago part of the nail came off on its own. No drainage or pus. No other treatment. She is getting some pain on the the left side, more discomfort mostly with walking.   Review of Systems  All other systems reviewed and are negative.  Past Medical History:  Diagnosis Date   Colitis    Glaucoma    Lymphedema     Past Surgical History:  Procedure Laterality Date   ANKLE SURGERY     CATARACT EXTRACTION, BILATERAL     eye injections       Current Outpatient Medications:    albuterol (ACCUNEB) 0.63 MG/3ML nebulizer solution, Take 1 ampule by nebulization every 6 (six) hours as needed for wheezing., Disp: , Rfl:    apraclonidine (IOPIDINE) 0.5 % ophthalmic solution, 2 (two) times daily., Disp: , Rfl:    carboxymethylcellulose (REFRESH TEARS) 0.5 % SOLN, Place 1 drop into both eyes 2 (two) times daily., Disp: , Rfl:    dorzolamide (TRUSOPT) 2 % ophthalmic solution, 1 drop 3 (three) times daily., Disp: , Rfl:    ibuprofen (ADVIL,MOTRIN) 400 MG tablet, Take 1 tablet (400 mg total) by mouth 3 (three) times daily. (Patient not taking: Reported on 07/26/2018), Disp: 21 tablet, Rfl: 0   latanoprost (XALATAN) 0.005 % ophthalmic solution, Place 1 drop into both eyes at bedtime., Disp: , Rfl: 6   meloxicam (MOBIC) 15 MG tablet, Take 15 mg by mouth daily. (Patient not taking: Reported on 12/09/2021), Disp: , Rfl:    prednisoLONE acetate (PRED MILD) 0.12 % ophthalmic suspension, 1 drop 4 (four) times daily., Disp: ,  Rfl:    terbinafine (LAMISIL) 250 MG tablet, Take 1 tablet (250 mg total) by mouth daily., Disp: 90 tablet, Rfl: 0   timolol (TIMOPTIC) 0.5 % ophthalmic solution, Place 1 drop into both eyes 2 (two) times daily., Disp: , Rfl: 6  No Known Allergies        Objective:  Physical Exam  General: AAO x3, NAD  Dermatological: Hyperkeratotic lesion noted left sub 5 without any underlying ulceration drainage or any signs of infection.  Hallux toenails hypertrophic, dystrophic with yellow, brown discoloration with central debris.  No edema, erythema.  Vascular: Dorsalis Pedis artery and Posterior Tibial artery pedal pulses are 2/4 bilateral with immedate capillary fill time.  There is no pain with calf compression, swelling, warmth, erythema.   Neruologic: Grossly intact via light touch bilateral.  Musculoskeletal: No gross boney pedal deformities bilateral. No pain, crepitus, or limitation noted with foot and ankle range of motion bilateral. Muscular strength 5/5 in all groups tested bilateral.  Gait: Unassisted, Nonantalgic.       Assessment:   Onychomycosis, hyperkeratotic lesion     Plan:  -Treatment options discussed including all alternatives, risks, and complications -Etiology of symptoms were discussed -Discussed treatment options for nail fungus including oral, topical as well as alternative treatments.  At this time the patient wants to proceed with oral Lamisil.  We discussed side effects of the medication and success rates.  We will check a CBC and LFT prior to starting the medication.  Once I receive the results of this I will then call the medication in.  -For the callus recommended moisturizer, offloading.  Vivi Barrack DPM

## 2021-12-09 NOTE — Patient Instructions (Signed)
Terbinafine Tablets What is this medication? TERBINAFINE (TER bin a feen) treats fungal infections of the nails. It belongs to a group of medications called antifungals. It will not treat infections caused by bacteria or viruses. This medicine may be used for other purposes; ask your health care provider or pharmacist if you have questions. COMMON BRAND NAME(S): Lamisil, Terbinex What should I tell my care team before I take this medication? They need to know if you have any of these conditions: Liver disease An unusual or allergic reaction to terbinafine, other medications, foods, dyes, or preservatives Pregnant or trying to get pregnant Breast-feeding How should I use this medication? Take this medication by mouth with water. Take it as directed on the prescription label at the same time every day. You can take it with or without food. If it upsets your stomach, take it with food. Keep taking it unless your care team tells you to stop. A special MedGuide will be given to you by the pharmacist with each prescription and refill. Be sure to read this information carefully each time. Talk to your care team regarding the use of this medication in children. Special care may be needed. Overdosage: If you think you have taken too much of this medicine contact a poison control center or emergency room at once. NOTE: This medicine is only for you. Do not share this medicine with others. What if I miss a dose? If you miss a dose, take it as soon as you can unless it is more than 4 hours late. If it is more than 4 hours late, skip the missed dose. Take the next dose at the normal time. What may interact with this medication? Do not take this medication with any of the following: Pimozide Thioridazine This medication may also interact with the following: Beta blockers Caffeine Certain medications for mental health conditions Cimetidine Cyclosporine Medications for fungal infections like fluconazole  and ketoconazole Medications for irregular heartbeat like amiodarone, flecainide and propafenone Rifampin Warfarin This list may not describe all possible interactions. Give your health care provider a list of all the medicines, herbs, non-prescription drugs, or dietary supplements you use. Also tell them if you smoke, drink alcohol, or use illegal drugs. Some items may interact with your medicine. What should I watch for while using this medication? Visit your care team for regular checks on your progress. You may need blood work while you are taking this medication. It may be some time before you see the benefit from this medication. This medication may cause serious skin reactions. They can happen weeks to months after starting the medication. Contact your care team right away if you notice fevers or flu-like symptoms with a rash. The rash may be red or purple and then turn into blisters or peeling of the skin. Or, you might notice a red rash with swelling of the face, lips or lymph nodes in your neck or under your arms. This medication can make you more sensitive to the sun. Keep out of the sun, If you cannot avoid being in the sun, wear protective clothing and sunscreen. Do not use sun lamps or tanning beds/booths. What side effects may I notice from receiving this medication? Side effects that you should report to your care team as soon as possible: Allergic reactions--skin rash, itching, hives, swelling of the face, lips, tongue, or throat Change in sense of smell Change in taste Infection--fever, chills, cough, or sore throat Liver injury--right upper belly pain, loss of appetite, nausea,   light-colored stool, dark yellow or brown urine, yellowing skin or eyes, unusual weakness or fatigue Low red blood cell level--unusual weakness or fatigue, dizziness, headache, trouble breathing Lupus-like syndrome--joint pain, swelling, or stiffness, butterfly-shaped rash on the face, rashes that get worse  in the sun, fever, unusual weakness or fatigue Rash, fever, and swollen lymph nodes Redness, blistering, peeling, or loosening of the skin, including inside the mouth Unusual bruising or bleeding Worsening mood, feelings of depression Side effects that usually do not require medical attention (report to your care team if they continue or are bothersome): Diarrhea Gas Headache Nausea Stomach pain Upset stomach This list may not describe all possible side effects. Call your doctor for medical advice about side effects. You may report side effects to FDA at 1-800-FDA-1088. Where should I keep my medication? Keep out of the reach of children and pets. Store between 20 and 25 degrees C (68 and 77 degrees F). Protect from light. Get rid of any unused medication after the expiration date. To get rid of medications that are no longer needed or have expired: Take the medication to a medication take-back program. Check with your pharmacy or law enforcement to find a location. If you cannot return the medication, check the label or package insert to see if the medication should be thrown out in the garbage or flushed down the toilet. If you are not sure, ask your care team. If it is safe to put it in the trash, take the medication out of the container. Mix the medication with cat litter, dirt, coffee grounds, or other unwanted substance. Seal the mixture in a bag or container. Put it in the trash. NOTE: This sheet is a summary. It may not cover all possible information. If you have questions about this medicine, talk to your doctor, pharmacist, or health care provider.  2023 Elsevier/Gold Standard (2020-07-14 00:00:00)  

## 2021-12-10 ENCOUNTER — Other Ambulatory Visit: Payer: Self-pay | Admitting: Podiatry

## 2021-12-10 DIAGNOSIS — Z79899 Other long term (current) drug therapy: Secondary | ICD-10-CM

## 2021-12-10 LAB — CBC WITH DIFFERENTIAL/PLATELET
Absolute Monocytes: 406 cells/uL (ref 200–950)
Basophils Absolute: 31 cells/uL (ref 0–200)
Basophils Relative: 0.6 %
Eosinophils Absolute: 270 cells/uL (ref 15–500)
Eosinophils Relative: 5.2 %
HCT: 41 % (ref 35.0–45.0)
Hemoglobin: 13.6 g/dL (ref 11.7–15.5)
Lymphs Abs: 1560 cells/uL (ref 850–3900)
MCH: 31.5 pg (ref 27.0–33.0)
MCHC: 33.2 g/dL (ref 32.0–36.0)
MCV: 94.9 fL (ref 80.0–100.0)
MPV: 10.9 fL (ref 7.5–12.5)
Monocytes Relative: 7.8 %
Neutro Abs: 2933 cells/uL (ref 1500–7800)
Neutrophils Relative %: 56.4 %
Platelets: 246 10*3/uL (ref 140–400)
RBC: 4.32 10*6/uL (ref 3.80–5.10)
RDW: 12.3 % (ref 11.0–15.0)
Total Lymphocyte: 30 %
WBC: 5.2 10*3/uL (ref 3.8–10.8)

## 2021-12-10 LAB — HEPATIC FUNCTION PANEL
AG Ratio: 1.2 (calc) (ref 1.0–2.5)
ALT: 12 U/L (ref 6–29)
AST: 21 U/L (ref 10–35)
Albumin: 3.8 g/dL (ref 3.6–5.1)
Alkaline phosphatase (APISO): 82 U/L (ref 37–153)
Bilirubin, Direct: 0.1 mg/dL (ref 0.0–0.2)
Globulin: 3.1 g/dL (calc) (ref 1.9–3.7)
Indirect Bilirubin: 0.1 mg/dL (calc) — ABNORMAL LOW (ref 0.2–1.2)
Total Bilirubin: 0.2 mg/dL (ref 0.2–1.2)
Total Protein: 6.9 g/dL (ref 6.1–8.1)

## 2021-12-10 MED ORDER — TERBINAFINE HCL 250 MG PO TABS: 250.0000 mg | ORAL_TABLET | Freq: Every day | ORAL | 0 refills | Status: AC

## 2021-12-11 DIAGNOSIS — B351 Tinea unguium: Secondary | ICD-10-CM | POA: Insufficient documentation
